# Patient Record
Sex: Female | Born: 2000 | Marital: Married | Smoking: Never smoker
Health system: Southern US, Community
[De-identification: ages and names within clinical notes are randomized; demographics above are authoritative.]

## PROBLEM LIST (undated history)

## (undated) DIAGNOSIS — E663 Overweight: Secondary | ICD-10-CM

## (undated) DIAGNOSIS — I871 Compression of vein: Secondary | ICD-10-CM

## (undated) DIAGNOSIS — U071 COVID-19: Secondary | ICD-10-CM

## (undated) HISTORY — DX: COVID-19: U07.1

## (undated) HISTORY — DX: Overweight: E66.3

## (undated) HISTORY — PX: WISDOM TOOTH EXTRACTION: SHX21

---
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
## 2022-01-23 NOTE — Therapy (Addendum)
?OUTPATIENT PHYSICAL THERAPY LOWER EXTREMITY EVALUATION ? ? ?Patient Name: Karen [name] ?MRN: [id number] ?DOB:04/30/2001, 21 y.o., female ?Today's Date: 01/24/2022 ? ? PT End of Session - 01/24/22 0956   ? ? Visit Number 1   ? Number of Visits 8   ? Date for PT Re-Evaluation 03/21/22   ? Authorization Type [place] Medicaid Healthy Blue   ? PT Start Time [phone]   pt late  ? PT Stop Time 1015   ? PT Time Calculation (min) 25 min   ? Activity Tolerance Patient tolerated treatment well   ? Behavior During [facility] for tasks assessed/performed   ? ?  ?  ? ?  ? ? ?Past Medical History:  ?Diagnosis Date  ? AKI (acute kidney injury) ([facility]) 11/23/2020  ? r/t dehydration - covid infection   ? COVID   ? Hepatic vein stenosis   ? Overweight(278.02) 03/03/2013  ? ?Past Surgical History:  ?Procedure Laterality Date  ? KNEE ARTHROSCOPY WITH ANTERIOR CRUCIATE LIGAMENT (ACL) REPAIR Left 04/13/2021  ? Procedure: KNEE ARTHROSCOPY WITH ANTERIOR CRUCIATE LIGAMENT (ACL) REPAIR WITH  LATERAL MENISECTOMY and DRILLING/MICROFRACTURE;  Surgeon: Teryl LucyLandau, Joshua, MD;  Location: [facility];  Service: Orthopedics;  Laterality: Left;  ? KNEE ARTHROSCOPY WITH MENISCAL REPAIR Left 04/13/2021  ? Procedure: KNEE ARTHROSCOPY WITH MENISCAL Root Repair;  Surgeon: Teryl LucyLandau, Joshua, MD;  Location: [facility];  Service: Orthopedics;  Laterality: Left;  ? WISDOM TOOTH EXTRACTION    ? ?Patient Active Problem List  ? Diagnosis Date Noted  ? Acute kidney injury ([facility]) 11/23/2020  ? Hepatic steatosis 11/23/2020  ? Obesity, pediatric, BMI 95th to 98th percentile for age 67/26/2022  ? Elevated BP without diagnosis of hypertension 11/23/2020  ? COVID-19 11/23/2020  ? AN (acanthosis nigricans) 12/09/2015  ? BMI (body mass index), pediatric, greater than or equal to 95% for age 14/07/2015  ? ? ?PCP: Richrd SoxJohnson, Quan T, MD ? ?REFERRING PROVIDER: Teryl LucyLandau, Joshua, MD ? ?REFERRING DIAG: 04/13/21: Lt Knee scope with ACL repair, lateral meniscectomy,  microfracture ? ?THERAPY DIAG:  ?S/P ACL repair ? ?Stiffness of left knee, not elsewhere classified ? ?Muscle weakness (generalized) ? ?Localized edema ? ?ONSET DATE: 04/13/21 ? ?SUBJECTIVE:  ? ?SUBJECTIVE STATEMENT: ?Patient was here for several visits last summer/fall.  She was in [place] for several months.  She continue to have issues with jogging (pain) and bending/squatting.  She reports buckling in her knee ( 1 x 2-3 weeks).  She reports some weakness with steps, climbing.  She goes to the gym (leg presses, light dead lifts) but does have pain during and sometimes after.  Dr. Dion SaucierLandau saw her in Feb. And said her knee is "the same".  ? ?PERTINENT HISTORY: ?6/22: ? ?KNEE ARTHROSCOPY WITH ANTERIOR CRUCIATE LIGAMENT (ACL) REPAIR WITH  LATERAL MENISECTOMY and DRILLING/MICROFRACTURE Left General KNEE ARTHROSCOPY WITH MENISCAL Root Repair  ? ?PAIN:  ?Are you having pain? No ? ?PRECAUTIONS: Other: none  ? ?WEIGHT BEARING RESTRICTIONS No ? ?FALLS:  ?Has patient fallen in last 6 months? No ? ?LIVING ENVIRONMENT: ?Lives with: lives with their family ?Lives in: House/apartment ?Stairs: Yes: External: 5 steps; none ?Has following equipment at home: None ? ?OCCUPATION: working at [employer] , part time  ? ?PLOF: Independent ? ?PATIENT GOALS Patient would like to get stronger, get better.  ? ? ?OBJECTIVE:  ? ?DIAGNOSTIC FINDINGS: none recent  ? ?PATIENT SURVEYS:  ?LEFS TBA  ? ?COGNITION: ? Overall cognitive status: Within functional limits for tasks assessed   ?  ?  SENSATION: ?WFL ? ?POSTURE:  ?WNL ? ?PALPATION: ?No tenderness in knee, some swelling laterally  ? ?LE ROM: ? ?Active ROM Right ?01/24/2022 Left ?01/24/2022  ?Hip flexion    ?Hip extension    ?Hip abduction    ?Hip adduction    ?Hip internal rotation    ?Hip external rotation    ?Knee flexion 130 120  ?Knee extension 0 4  ?Ankle dorsiflexion    ?Ankle plantarflexion    ?Ankle inversion    ?Ankle eversion    ? (Blank rows = not tested) ? ?LE MMT: ? ?MMT Right ?01/24/2022  Left ?01/24/2022  ?Hip flexion 5 5  ?Hip extension 5 5  ?Hip abduction 5 4+  ?Hip adduction 3+ 4  ?Hip internal rotation    ?Hip external rotation    ?Knee flexion 5 4+  ?Knee extension 5 4  ?Ankle dorsiflexion    ?Ankle plantarflexion    ?Ankle inversion    ?Ankle eversion    ? (Blank rows = not tested) ? ?8 deg quad lag  ? ? ?FUNCTIONAL TESTS:  ?Normal SLS  , weak and shaky quad eccentric off step ,unable to perform heel tap on Rt LE  ?  Good squat form , knee adducts slightly  ?GAIT: ?Distance walked: 250 ?Assistive device utilized: None ?Level of assistance: Complete Independence ?Comments: no deviations  ? ? ? ?TODAY'S TREATMENT: ?Eval only ? Leg press 1 plates single leg x 10 ?   3 plates double leg x 10  ? ?PATIENT EDUCATION:  ?Education details: PT, leg press and progressive overload ?Person educated: Patient ?Education method: Explanation, Demonstration, and Verbal cues ?Education comprehension: verbalized understanding and needs further education ? ? ?HOME EXERCISE PROGRAM: ?TBA  ? ?ASSESSMENT: ? ?CLINICAL IMPRESSION: ?Patient is a 21 y.o. female  who was seen today for physical therapy evaluation and treatment for L knee surgery. After several months she continues to have intermittent pain, swelling and reduced AROM of L knee.  She has poor control with decent down stairs. She continues to work out at [employer] but with insufficient weight to increase L quad strength.   ? ? ?OBJECTIVE IMPAIRMENTS decreased mobility, decreased ROM, decreased strength, increased edema, impaired flexibility, and pain.  ? ?ACTIVITY LIMITATIONS community activity.  ? ?PERSONAL FACTORS Time since onset of injury/illness/exacerbation are also affecting patient's functional outcome.  ? ? ?REHAB POTENTIAL: Excellent ? ?CLINICAL DECISION MAKING: Stable/uncomplicated ? ?EVALUATION COMPLEXITY: Low ? ? ?GOALS: ?Goals reviewed with patient? Yes ? ? ?LONG TERM GOALS: Target date: 03/21/2022 ? ?Pt will be able to perform gym exercises  without pain (leg press, dead lift, etc) ?Baseline: min pain post  ?Goal status: INITIAL ? ?2.  Pt will be able to do light jog without knee pain or instability, self selected distance/time.  ?Baseline: does rarely, pain min ?Goal status: INITIAL ? ?3.  Pt will be able descend steps with good reciprocal control, no UE assist ?Baseline: unable , 8 inch  ?Goal status: INITIAL ? ?4.  Pt will achieve AROM without pain 125 -130 deg.  ?Baseline: 120 deg flexion  ?Goal status: INITIAL ? ?5.  Pt will perform SLR on LLE without quad lag.  ?Baseline: 8-10 deg LLE ?Goal status: INITIAL ? ? ? ?PLAN: ?PT FREQUENCY: 1x/week ? ?PT DURATION: 8 weeks ? ?PLANNED INTERVENTIONS: Therapeutic exercises, Therapeutic activity, Neuromuscular re-education, Gait training, Patient/Family education, Joint mobilization, Cryotherapy, Taping, and Manual therapy ? ?PLAN FOR NEXT SESSION: LEFS.  HEP, quad strength , closed chain  ? ? ? ?  Karie Mainland, PT ?01/24/22 10:39 AM ?Phone: [phone] ?Fax: [phone]  ?Grainger Mccarley, PT ?01/24/2022, 10:22 AM  ? ? ?Check all possible CPT codes: 54650- Therapeutic Exercise, [phone]- Neuro Re-education, [phone] - Gait Training, [phone] - Manual Therapy, [phone] - Therapeutic Activities, [phone] - Self Care, [phone] - Vaso, and [phone] - Physical performance training    ? ?If treatment provided at initial evaluation, no treatment charged due to lack of authorization.    ?  ?

## 2022-01-24 ENCOUNTER — Other Ambulatory Visit: Payer: Self-pay

## 2022-01-24 ENCOUNTER — Ambulatory Visit: Payer: Medicaid Other | Attending: Orthopedic Surgery | Admitting: Physical Therapy

## 2022-01-24 ENCOUNTER — Encounter: Payer: Self-pay | Admitting: Physical Therapy

## 2022-01-24 DIAGNOSIS — Z9889 Other specified postprocedural states: Secondary | ICD-10-CM | POA: Insufficient documentation

## 2022-01-24 DIAGNOSIS — M25662 Stiffness of left knee, not elsewhere classified: Secondary | ICD-10-CM | POA: Insufficient documentation

## 2022-01-24 DIAGNOSIS — R600 Localized edema: Secondary | ICD-10-CM | POA: Insufficient documentation

## 2022-01-24 DIAGNOSIS — M6281 Muscle weakness (generalized): Secondary | ICD-10-CM | POA: Diagnosis not present

## 2022-02-01 ENCOUNTER — Ambulatory Visit: Payer: Medicaid Other | Attending: Orthopedic Surgery | Admitting: Physical Therapy

## 2022-02-01 ENCOUNTER — Encounter: Payer: Self-pay | Admitting: Physical Therapy

## 2022-02-01 DIAGNOSIS — Z9889 Other specified postprocedural states: Secondary | ICD-10-CM | POA: Diagnosis not present

## 2022-02-01 DIAGNOSIS — M6281 Muscle weakness (generalized): Secondary | ICD-10-CM | POA: Diagnosis not present

## 2022-02-01 DIAGNOSIS — M25662 Stiffness of left knee, not elsewhere classified: Secondary | ICD-10-CM | POA: Diagnosis not present

## 2022-02-01 DIAGNOSIS — R600 Localized edema: Secondary | ICD-10-CM | POA: Insufficient documentation

## 2022-02-01 NOTE — Therapy (Signed)
?OUTPATIENT PHYSICAL THERAPY TREATMENT NOTE ? ? ?Patient Name: Karen [name] ?MRN: [id number] ?DOB:2001/05/06, 21 y.o., female ?Today's Date: 02/01/2022 ? ?PCP: Richrd Sox, MD ?REFERRING PROVIDER: Richrd Sox, MD ? ?END OF SESSION:  ? PT End of Session - 02/01/22 1319   ? ? Visit Number 2   ? Number of Visits 8   ? Date for PT Re-Evaluation 03/21/22   ? Authorization Type [place] Medicaid Healthy Blue   ? Authorization Time Period 01/25/22-03/23/22   ? Authorization - Visit Number 1   ? Authorization - Number of Visits 8   ? PT Start Time 1315   ? PT Stop Time 1359   ? PT Time Calculation (min) 44 min   ? ?  ?  ? ?  ? ? ?Past Medical History:  ?Diagnosis Date  ? AKI (acute kidney injury) ([facility]) 11/23/2020  ? r/t dehydration - covid infection   ? COVID   ? Hepatic vein stenosis   ? Overweight(278.02) 03/03/2013  ? ?Past Surgical History:  ?Procedure Laterality Date  ? KNEE ARTHROSCOPY WITH ANTERIOR CRUCIATE LIGAMENT (ACL) REPAIR Left 04/13/2021  ? Procedure: KNEE ARTHROSCOPY WITH ANTERIOR CRUCIATE LIGAMENT (ACL) REPAIR WITH  LATERAL MENISECTOMY and DRILLING/MICROFRACTURE;  Surgeon: Teryl Lucy, MD;  Location: [facility];  Service: Orthopedics;  Laterality: Left;  ? KNEE ARTHROSCOPY WITH MENISCAL REPAIR Left 04/13/2021  ? Procedure: KNEE ARTHROSCOPY WITH MENISCAL Root Repair;  Surgeon: Teryl Lucy, MD;  Location: [facility];  Service: Orthopedics;  Laterality: Left;  ? WISDOM TOOTH EXTRACTION    ? ?Patient Active Problem List  ? Diagnosis Date Noted  ? Acute kidney injury ([facility]) 11/23/2020  ? Hepatic steatosis 11/23/2020  ? Obesity, pediatric, BMI 95th to 98th percentile for age 76/26/2022  ? Elevated BP without diagnosis of hypertension 11/23/2020  ? COVID-19 11/23/2020  ? AN (acanthosis nigricans) 12/09/2015  ? BMI (body mass index), pediatric, greater than or equal to 95% for age 72/07/2015  ?PCP: Richrd Sox, MD ?  ?REFERRING PROVIDER: Teryl Lucy, MD ?  ?REFERRING DIAG:  04/13/21: Lt Knee scope with ACL repair, lateral meniscectomy, microfracture ?  ?THERAPY DIAG:  ?S/P ACL repair ? ?THERAPY DIAG:  ?S/P ACL repair ? ?Stiffness of left knee, not elsewhere classified ? ?Muscle weakness (generalized) ? ?PERTINENT HISTORY: 6/22: ?  ?KNEE ARTHROSCOPY WITH ANTERIOR CRUCIATE LIGAMENT (ACL) REPAIR WITH  LATERAL MENISECTOMY and DRILLING/MICROFRACTURE Left General KNEE ARTHROSCOPY WITH MENISCAL Root Repair  ? ?PRECAUTIONS: None ? ?SUBJECTIVE: Not really having any pain. I started doing the exercises from before.  ? ? ?PAIN:  ?Are you having pain? No ? ? ?OBJECTIVE:  ?  ?DIAGNOSTIC FINDINGS: none recent  ?  ?PATIENT SURVEYS:  ?LEFS 59/80 (02/01/22) ?  ?COGNITION: ?          Overall cognitive status: Within functional limits for tasks assessed               ?           ?SENSATION: ?WFL ?  ?POSTURE:  ?WNL ?  ?PALPATION: ?No tenderness in knee, some swelling laterally  ?  ?LE ROM: ?  ?Active ROM Right ?01/24/2022 Left ?01/24/2022  ?Hip flexion      ?Hip extension      ?Hip abduction      ?Hip adduction      ?Hip internal rotation      ?Hip external rotation      ?Knee flexion 130 120  ?Knee extension 0 4  ?  Ankle dorsiflexion      ?Ankle plantarflexion      ?Ankle inversion      ?Ankle eversion      ? (Blank rows = not tested) ?  ?LE MMT: ?  ?MMT Right ?01/24/2022 Left ?01/24/2022  ?Hip flexion 5 5  ?Hip extension 5 5  ?Hip abduction 5 4+  ?Hip adduction 3+ 4  ?Hip internal rotation      ?Hip external rotation      ?Knee flexion 5 4+  ?Knee extension 5 4  ?Ankle dorsiflexion      ?Ankle plantarflexion      ?Ankle inversion      ?Ankle eversion      ? (Blank rows = not tested) ?  ?8 deg quad lag  ?  ?  ?FUNCTIONAL TESTS:  ?Normal SLS  , weak and shaky quad eccentric off step ,unable to perform heel tap on Rt LE  ?                      Good squat form , knee adducts slightly  ?GAIT: ?Distance walked: 250 ?Assistive device utilized: None ?Level of assistance: Complete Independence ?Comments: no deviations   ?  ?  ?  ?TODAY'S TREATMENT: ?[facility] Adult PT Treatment:                                                DATE: 02/01/22 ?Therapeutic Exercise: ?Elliptical L5 x 5 min ?Wall squats 5 sec x 12 ?6 inch runners step up ?4 inch lateral step down x 12 ?2 inch front step down x 12 ?Leg press 60# 10 x 2  ?Slant board stretch ?Gastroc stretch ?Quad stretch ?Hamstring stretch ?STS with RLE back  ? ?INITIAL TREATMENT:  ?Eval only ?  Leg press 1 plates single leg x 10 ?                      3 plates double leg x 10  ?  ?PATIENT EDUCATION:  ?Education details: PT, leg press and progressive overload ?Person educated: Patient ?Education method: Explanation, Demonstration, and Verbal cues ?Education comprehension: verbalized understanding and needs further education ?  ?  ?HOME EXERCISE PROGRAM: ?Access Code: [email or web address] ?URL: [email or web address] ?Date: 02/01/2022 ?Prepared by: Jannette Spanner ? ?Exercises ?- Runner's Step Up/Down  - 1 x daily - 7 x weekly - 3 sets - 10 reps ?- Single Leg Squat with Chair Touch  - 1 x daily - 7 x weekly - 3 sets - 10 reps ?- Supine Straight Leg Raises  - 1 x daily - 7 x weekly - 3 sets - 10 reps ?- Prone Quadriceps Stretch with Strap  - 1 x daily - 7 x weekly - 1 sets - 3 reps - 30 hold ?- Hooklying Hamstring Stretch with Strap  - 1 x daily - 7 x weekly - 1 sets - 3 reps - 30 hold ?  ?ASSESSMENT: ?  ?CLINICAL IMPRESSION: ?Karen [name] arrives for treatment without pain and reports that she has resumed prior HEP. Reviewed old HEP and issued a new version to start. Focused closed chain with pt reporting left plantar foot burning which she reports is a frequent occurrence. Added calf, quad and hamstring stretches.  ?  ?  ?OBJECTIVE IMPAIRMENTS decreased mobility, decreased ROM, decreased strength, increased edema, impaired flexibility, and pain.  ?  ?  ACTIVITY LIMITATIONS community activity.  ?  ?PERSONAL FACTORS Time since onset of injury/illness/exacerbation are also affecting patient's functional  outcome.  ?  ?  ?REHAB POTENTIAL: Excellent ?  ?CLINICAL DECISION MAKING: Stable/uncomplicated ?  ?EVALUATION COMPLEXITY: Low ?  ?  ?GOALS: ?Goals reviewed with patient? Yes ?  ?  ?LONG TERM GOALS: Target date: 03/21/2022 ?  ?Pt will be able to perform gym exercises without pain (leg press, dead lift, etc) ?Baseline: min pain post  ?Goal status: INITIAL ?  ?2.  Pt will be able to do light jog without knee pain or instability, self selected distance/time.  ?Baseline: does rarely, pain min ?Goal status: INITIAL ?  ?3.  Pt will be able descend steps with good reciprocal control, no UE assist ?Baseline: unable , 8 inch  ?Goal status: INITIAL ?  ?4.  Pt will achieve AROM without pain 125 -130 deg.  ?Baseline: 120 deg flexion  ?Goal status: INITIAL ?  ?5.  Pt will perform SLR on LLE without quad lag.  ?Baseline: 8-10 deg LLE ?Goal status: INITIAL ?  ?  ?  ?PLAN: ?PT FREQUENCY: 1x/week ?  ?PT DURATION: 8 weeks ?  ?PLANNED INTERVENTIONS: Therapeutic exercises, Therapeutic activity, Neuromuscular re-education, Gait training, Patient/Family education, Joint mobilization, Cryotherapy, Taping, and Manual therapy ?  ?PLAN FOR NEXT SESSION:   check new HEP and progress quad strength , closed chain  ?  ?  ?  ? ? ?Jannette SpannerJessica Graycie Halley, PTA ?02/01/22 2:13 PM ?Phone: [phone] ?Fax: [phone]  ? ?   ?

## 2022-02-12 ENCOUNTER — Ambulatory Visit: Payer: Medicaid Other | Admitting: Physical Therapy

## 2022-02-12 ENCOUNTER — Encounter: Payer: Self-pay | Admitting: Physical Therapy

## 2022-02-12 DIAGNOSIS — Z9889 Other specified postprocedural states: Secondary | ICD-10-CM | POA: Diagnosis not present

## 2022-02-12 DIAGNOSIS — R600 Localized edema: Secondary | ICD-10-CM

## 2022-02-12 DIAGNOSIS — M25662 Stiffness of left knee, not elsewhere classified: Secondary | ICD-10-CM

## 2022-02-12 DIAGNOSIS — M6281 Muscle weakness (generalized): Secondary | ICD-10-CM | POA: Diagnosis not present

## 2022-02-12 NOTE — Therapy (Signed)
?OUTPATIENT PHYSICAL THERAPY TREATMENT NOTE ? ? ?Patient Name: Karen [name] ?MRN: [id number] ?DOB:[birth year], 21 y.o., female ?Today's Date: 02/12/2022 ? ?PCP: Kyra Leyland, MD ?REFERRING PROVIDER: Kyra Leyland, MD ? ?END OF SESSION:  ? PT End of Session - 02/12/22 0934   ? ? Visit Number 3   ? Number of Visits 8   ? Date for PT Re-Evaluation 03/21/22   ? Authorization Type [place] Medicaid [employer]   ? Authorization Time Period 01/25/22-03/23/22   ? Authorization - Visit Number 2   ? Authorization - Number of Visits 8   ? PT Start Time 0930   ? PT Stop Time 1012   ? PT Time Calculation (min) 42 min   ? ?  ?  ? ?  ? ? ?Past Medical History:  ?Diagnosis Date  ? AKI (acute kidney injury) ([place]) 11/23/2020  ? r/t dehydration - covid infection   ? COVID   ? Hepatic vein stenosis   ? Overweight(278.02) 03/03/2013  ? ?Past Surgical History:  ?Procedure Laterality Date  ? KNEE ARTHROSCOPY WITH ANTERIOR CRUCIATE LIGAMENT (ACL) REPAIR Left 04/13/2021  ? Procedure: KNEE ARTHROSCOPY WITH ANTERIOR CRUCIATE LIGAMENT (ACL) REPAIR WITH  LATERAL MENISECTOMY and DRILLING/MICROFRACTURE;  Surgeon: Marchia Bond, MD;  Location: [facility];  Service: Orthopedics;  Laterality: Left;  ? KNEE ARTHROSCOPY WITH MENISCAL REPAIR Left 04/13/2021  ? Procedure: KNEE ARTHROSCOPY WITH MENISCAL Root Repair;  Surgeon: Marchia Bond, MD;  Location: [facility];  Service: Orthopedics;  Laterality: Left;  ? WISDOM TOOTH EXTRACTION    ? ?Patient Active Problem List  ? Diagnosis Date Noted  ? Acute kidney injury ([place]) 11/23/2020  ? Hepatic steatosis 11/23/2020  ? Obesity, pediatric, BMI 95th to 98th percentile for age 39/26/2022  ? Elevated BP without diagnosis of hypertension 11/23/2020  ? COVID-19 11/23/2020  ? AN (acanthosis nigricans) 12/09/2015  ? BMI (body mass index), pediatric, greater than or equal to 95% for age 74/07/2015  ?PCP: Kyra Leyland, MD ?  ?REFERRING PROVIDER: Marchia Bond, MD ?  ?REFERRING DIAG:  04/13/21: Lt Knee scope with ACL repair, lateral meniscectomy, microfracture ?  ?THERAPY DIAG:  ?S/P ACL repair ? ?THERAPY DIAG:  ?S/P ACL repair ? ?Stiffness of left knee, not elsewhere classified ? ?Muscle weakness (generalized) ? ?Localized edema ? ?PERTINENT HISTORY: 6/22: ?  ?KNEE ARTHROSCOPY WITH ANTERIOR CRUCIATE LIGAMENT (ACL) REPAIR WITH  LATERAL MENISECTOMY and DRILLING/MICROFRACTURE Left General KNEE ARTHROSCOPY WITH MENISCAL Root Repair  ? ?PRECAUTIONS: None ? ?SUBJECTIVE: The exercises have helped my confidence.  ? ? ?PAIN:  ?Are you having pain? No ? ? ?OBJECTIVE:  ?  ?DIAGNOSTIC FINDINGS: none recent  ?  ?PATIENT SURVEYS:  ?LEFS 59/80 (02/01/22) ?  ?COGNITION: ?          Overall cognitive status: Within functional limits for tasks assessed               ?           ?SENSATION: ?WFL ?  ?POSTURE:  ?WNL ?  ?PALPATION: ?No tenderness in knee, some swelling laterally  ?  ?LE ROM: ?  ?Active ROM Right ?01/24/2022 Left ?01/24/2022  ?Hip flexion      ?Hip extension      ?Hip abduction      ?Hip adduction      ?Hip internal rotation      ?Hip external rotation      ?Knee flexion 130 120  ?Knee extension 0 4  ?Ankle dorsiflexion      ?  Ankle plantarflexion      ?Ankle inversion      ?Ankle eversion      ? (Blank rows = not tested) ?  ?LE MMT: ?  ?MMT Right ?01/24/2022 Left ?01/24/2022  ?Hip flexion 5 5  ?Hip extension 5 5  ?Hip abduction 5 4+  ?Hip adduction 3+ 4  ?Hip internal rotation      ?Hip external rotation      ?Knee flexion 5 4+  ?Knee extension 5 4  ?Ankle dorsiflexion      ?Ankle plantarflexion      ?Ankle inversion      ?Ankle eversion      ? (Blank rows = not tested) ?  ?8 deg quad lag  ?  ?  ?FUNCTIONAL TESTS:  ?Normal SLS  , weak and shaky quad eccentric off step ,unable to perform heel tap on Rt LE  ?                      Good squat form , knee adducts slightly  ?GAIT: ?Distance walked: 250 ?Assistive device utilized: None ?Level of assistance: Complete Independence ?Comments: no deviations  ?  ?  ?   ?TODAY'S TREATMENT: ?[facility] Adult PT Treatment:                                                DATE: 02/12/22 ?Therapeutic Exercise: ?Rec Bike  L4 x 5 min ?Wall squats 5 sec x 10x2 ?6 inch runners step up 10x 2 - cues for control -no UE ?6 inch lateral step down x 10 x 2- cues for control - no UE ?2 inch front step down x 12- 1 UE assist  ?Leg press 60# 10 x 1, single leg 20# x 10 eccentric ?Slant board stretch- c/o heel pain ?SLS with ABCs holding small physioball- complained of plantar foot apin  ?Quad stretch with strap 30 sec x 2  ?Hamstring stretch ?STS with LLE back 10 x 2  ?Side hip adduction 10 x 2  ? ?[facility] Adult PT Treatment:                                                DATE: 02/01/22 ?Therapeutic Exercise: ?Elliptical L5 x 5 min ?Wall squats 5 sec x 12 ?6 inch runners step up ?4 inch lateral step down x 12 ?4 inch front step down x 10 ?Leg press 60# 10 x 2  ?Slant board stretch ?Gastroc stretch ?Quad stretch ?Hamstring stretch ?STS with LLE back  ? ?INITIAL TREATMENT:  ?Eval only ?  Leg press 1 plates single leg x 10 ?                      3 plates double leg x 10  ?  ?PATIENT EDUCATION:  ?Education details: PT, leg press and progressive overload ?Person educated: Patient ?Education method: Explanation, Demonstration, and Verbal cues ?Education comprehension: verbalized understanding and needs further education ?  ?  ?HOME EXERCISE PROGRAM: ?Access Code: [email or web address] ?URL: [email or web address] ?Date: 02/01/2022 ?Prepared by: Hessie Diener ? ?Exercises ?- Runner's Step Up/Down  - 1 x daily - 7 x weekly - 3 sets - 10 reps ?- Single Leg Squat with Chair  Touch  - 1 x daily - 7 x weekly - 3 sets - 10 reps ?- Supine Straight Leg Raises  - 1 x daily - 7 x weekly - 3 sets - 10 reps ?- Prone Quadriceps Stretch with Strap  - 1 x daily - 7 x weekly - 1 sets - 3 reps - 30 hold ?- Hooklying Hamstring Stretch with Strap  - 1 x daily - 7 x weekly - 1 sets - 3 reps - 30 hold ?  ?ASSESSMENT: ?  ?CLINICAL  IMPRESSION: ?[name] arrives for treatment without pain and reports that she has completed the new HEP and feels stronger and more confident in her left knee. She has not experienced plantar pain with her HEP so far. Reviewed HEP and she tolerated well without knee pain but did have plantar foot pain with SLS exercises.  ? ?OBJECTIVE IMPAIRMENTS decreased mobility, decreased ROM, decreased strength, increased edema, impaired flexibility, and pain.  ?  ?ACTIVITY LIMITATIONS community activity.  ?  ?PERSONAL FACTORS Time since onset of injury/illness/exacerbation are also affecting patient's functional outcome.  ?  ?  ?REHAB POTENTIAL: Excellent ?  ?CLINICAL DECISION MAKING: Stable/uncomplicated ?  ?EVALUATION COMPLEXITY: Low ?  ?  ?GOALS: ?Goals reviewed with patient? Yes ?  ?  ?LONG TERM GOALS: Target date: 03/21/2022 ?  ?Pt will be able to perform gym exercises without pain (leg press, dead lift, etc) ?Baseline: min pain post  ?Goal status: INITIAL ?  ?2.  Pt will be able to do light jog without knee pain or instability, self selected distance/time.  ?Baseline: does rarely, pain min ?Goal status: INITIAL ?  ?3.  Pt will be able descend steps with good reciprocal control, no UE assist ?Baseline: unable , 8 inch  ?Goal status: INITIAL ?  ?4.  Pt will achieve AROM without pain 125 -130 deg.  ?Baseline: 120 deg flexion  ?Goal status: INITIAL ?  ?5.  Pt will perform SLR on LLE without quad lag.  ?Baseline: 8-10 deg LLE ?Goal status: INITIAL ?  ?  ?  ?PLAN: ?PT FREQUENCY: 1x/week ?  ?PT DURATION: 8 weeks ?  ?PLANNED INTERVENTIONS: Therapeutic exercises, Therapeutic activity, Neuromuscular re-education, Gait training, Patient/Family education, Joint mobilization, Cryotherapy, Taping, and Manual therapy ?  ?PLAN FOR NEXT SESSION:   check new HEP and progress quad strength , closed chain , monitor plantar foot pain ?  ?  ?  ? ? ?Hessie Diener, PTA ?02/12/22 10:12 AM ?Phone: [phone] ?Fax: [phone]  ? ?   ?

## 2022-02-19 ENCOUNTER — Ambulatory Visit: Payer: Medicaid Other | Admitting: Physical Therapy

## 2022-02-19 ENCOUNTER — Encounter: Payer: Self-pay | Admitting: Physical Therapy

## 2022-02-19 DIAGNOSIS — Z9889 Other specified postprocedural states: Secondary | ICD-10-CM

## 2022-02-19 DIAGNOSIS — R600 Localized edema: Secondary | ICD-10-CM

## 2022-02-19 DIAGNOSIS — M6281 Muscle weakness (generalized): Secondary | ICD-10-CM

## 2022-02-19 DIAGNOSIS — M25662 Stiffness of left knee, not elsewhere classified: Secondary | ICD-10-CM

## 2022-02-19 NOTE — Therapy (Addendum)
OUTPATIENT PHYSICAL THERAPY TREATMENT NOTE Addended Discharge   Patient Name: Karen [name] MRN: [id number] DOB:02-10-2001, 21 y.o., female Today's Date: 02/19/2022  PCP: Kyra Leyland, MD REFERRING PROVIDER: Kyra Leyland, MD  END OF SESSION:   PT End of Session - 02/19/22 1022     Visit Number 4    Number of Visits 8    Date for PT Re-Evaluation 03/21/22    Authorization Type [place] Medicaid Healthy Blue    Authorization Time Period 01/25/22-03/23/22    Authorization - Visit Number 3    Authorization - Number of Visits 8    PT Start Time 2119    PT Stop Time 4174    PT Time Calculation (min) 40 min             Past Medical History:  Diagnosis Date   AKI (acute kidney injury) ([place]) 11/23/2020   r/t dehydration - covid infection    COVID    Hepatic vein stenosis    Overweight(278.02) 03/03/2013   Past Surgical History:  Procedure Laterality Date   KNEE ARTHROSCOPY WITH ANTERIOR CRUCIATE LIGAMENT (ACL) REPAIR Left 04/13/2021   Procedure: KNEE ARTHROSCOPY WITH ANTERIOR CRUCIATE LIGAMENT (ACL) REPAIR WITH  LATERAL MENISECTOMY and DRILLING/MICROFRACTURE;  Surgeon: Marchia Bond, MD;  Location: [place];  Service: Orthopedics;  Laterality: Left;   KNEE ARTHROSCOPY WITH MENISCAL REPAIR Left 04/13/2021   Procedure: KNEE ARTHROSCOPY WITH MENISCAL Root Repair;  Surgeon: Marchia Bond, MD;  Location: [facility];  Service: Orthopedics;  Laterality: Left;   WISDOM TOOTH EXTRACTION     Patient Active Problem List   Diagnosis Date Noted   Acute kidney injury ([place]) 11/23/2020   Hepatic steatosis 11/23/2020   Obesity, pediatric, BMI 95th to 98th percentile for age 67/26/2022   Elevated BP without diagnosis of hypertension 11/23/2020   COVID-19 11/23/2020   AN (acanthosis nigricans) 12/09/2015   BMI (body mass index), pediatric, greater than or equal to 95% for age 62/07/2015  PCP: Kyra Leyland, MD   REFERRING PROVIDER: Marchia Bond, MD    REFERRING DIAG: 04/13/21: Lt Knee scope with ACL repair, lateral meniscectomy, microfracture   THERAPY DIAG:  S/P ACL repair  THERAPY DIAG:  S/P ACL repair  Stiffness of left knee, not elsewhere classified  Localized edema  Muscle weakness (generalized)  PERTINENT HISTORY: 6/22:   KNEE ARTHROSCOPY WITH ANTERIOR CRUCIATE LIGAMENT (ACL) REPAIR WITH  LATERAL MENISECTOMY and DRILLING/MICROFRACTURE Left General KNEE ARTHROSCOPY WITH MENISCAL Root Repair   PRECAUTIONS: None  SUBJECTIVE: The exercises have helped my confidence.    PAIN:  Are you having pain? No   OBJECTIVE:    DIAGNOSTIC FINDINGS: none recent    PATIENT SURVEYS:  LEFS 59/80 (02/01/22)   COGNITION:           Overall cognitive status: Within functional limits for tasks assessed                          SENSATION: WFL   POSTURE:  WNL   PALPATION: No tenderness in knee, some swelling laterally    LE ROM:   Active ROM Right 01/24/2022 Left 01/24/2022 Left 02/19/22  Hip flexion       Hip extension       Hip abduction       Hip adduction       Hip internal rotation       Hip external rotation       Knee flexion  130 120 120  Knee extension 0 4 0  Ankle dorsiflexion       Ankle plantarflexion       Ankle inversion       Ankle eversion        (Blank rows = not tested)   LE MMT:   MMT Right 01/24/2022 Left 01/24/2022  Hip flexion 5 5  Hip extension 5 5  Hip abduction 5 4+  Hip adduction 3+ 4  Hip internal rotation      Hip external rotation      Knee flexion 5 4+  Knee extension 5 4  Ankle dorsiflexion      Ankle plantarflexion      Ankle inversion      Ankle eversion       (Blank rows = not tested)   8 deg quad lag      FUNCTIONAL TESTS:  Normal SLS  , weak and shaky quad eccentric off step ,unable to perform heel tap on Rt LE                        Good squat form , knee adducts slightly  GAIT: Distance walked: 250 Assistive device utilized: None Level of assistance: Complete  Independence Comments: no deviations        TODAY'S TREATMENT: [facility] Adult PT Treatment:                                                DATE: 02/19/22 Therapeutic Exercise: Elliptical L5 R5 x 5 minutes  Wall squats 5 sec x 10x2  10#  8 inch runners step up 10x 2 - cues for control -no UE 6 inch lateral step down x 10 x 2- cues for control - no UE 2 inch front step down x 12- 1 UE assist -has plantar pain Static lunge in doorway x 12  Slant board stretch Plantar/toe extension stretch against [employer] leg  AROM knee flexion 0-120  Quad stretch with strap 30 sec x 2  Hamstring stretch Standing Df stretch at wall  Side hip adduction 10 x 2 each  [facility] Adult PT Treatment:                                                DATE: 02/12/22 Therapeutic Exercise: Rec Bike  L4 x 5 min Wall squats 5 sec x 10x2 6 inch runners step up 10x 2 - cues for control -no UE 6 inch lateral step down x 10 x 2- cues for control - no UE 2 inch front step down x 12- 1 UE assist  Leg press 60# 10 x 1, single leg 20# x 10 eccentric Slant board stretch- c/o heel pain SLS with ABCs holding small physioball- complained of plantar foot apin  Quad stretch with strap 30 sec x 2  Hamstring stretch STS with LLE back 10 x 2  Side hip adduction 10 x 2   [facility] Adult PT Treatment:  DATE: 02/01/22 Therapeutic Exercise: Elliptical L5 x 5 min Wall squats 5 sec x 12 6 inch runners step up 4 inch lateral step down x 12 4 inch front step down x 10 Leg press 60# 10 x 2  Slant board stretch Gastroc stretch Quad stretch Hamstring stretch STS with LLE back   INITIAL TREATMENT:  Eval only   Leg press 1 plates single leg x 10                       3 plates double leg x 10    PATIENT EDUCATION:  Education details: PT, leg press and progressive overload Person educated: Patient Education method: Consulting civil engineer, Demonstration, and Verbal cues Education comprehension: verbalized  understanding and needs further education     HOME EXERCISE PROGRAM: Access Code: [email or web address] URL: [email or web address] Date: 02/19/2022 Prepared by: Hessie Diener  Exercises - Runner's Step Up/Down  - 1 x daily - 7 x weekly - 3 sets - 10 reps - Single Leg Squat with Chair Touch  - 1 x daily - 7 x weekly - 3 sets - 10 reps - Supine Straight Leg Raises  - 1 x daily - 7 x weekly - 3 sets - 10 reps - Prone Quadriceps Stretch with Strap  - 1 x daily - 7 x weekly - 1 sets - 3 reps - 30 hold - Hooklying Hamstring Stretch with Strap  - 1 x daily - 7 x weekly - 1 sets - 3 reps - 30 hold - Standing Gastroc Stretch at Counter  - 1 x daily - 7 x weekly - 1 sets - 3 reps - 30 hold   ASSESSMENT:   CLINICAL IMPRESSION: Mayci arrives for treatment without pain and reports improved confidence with stair climbing and squatting/bending over.  Knee flexion AROM improved for extension- no longer has quad lag. Knee flexion AROM unchanged. Reviewed prone quad stretch. She continues to have intermittent plantar pain with single leg exercises on LLE. Visual inspection of supine LEs reveals decreased ankle DF left vs right. She was given DF stretch for HEP. Otherwise ankle strength 5/5 DF, inv, ev. Need to test SL heel raise for calf strength.   OBJECTIVE IMPAIRMENTS decreased mobility, decreased ROM, decreased strength, increased edema, impaired flexibility, and pain.    ACTIVITY LIMITATIONS community activity.    PERSONAL FACTORS Time since onset of injury/illness/exacerbation are also affecting patient's functional outcome.      REHAB POTENTIAL: Excellent   CLINICAL DECISION MAKING: Stable/uncomplicated   EVALUATION COMPLEXITY: Low     GOALS: Goals reviewed with patient? Yes     LONG TERM GOALS: Target date: 03/21/2022   Pt will be able to perform gym exercises without pain (leg press, dead lift, etc) Baseline: min pain post  Goal status: INITIAL   2.  Pt will be able to do light  jog without knee pain or instability, self selected distance/time.  Baseline: does rarely, pain min Goal status: INITIAL   3.  Pt will be able descend steps with good reciprocal control, no UE assist Baseline: unable , 8 inch  Goal status: INITIAL   4.  Pt will achieve AROM without pain 125 -130 deg.  Baseline: 120 deg flexion  Goal status: INITIAL   5.  Pt will perform SLR on LLE without quad lag.  Baseline: 8-10 deg LLE Goal status: INITIAL       PLAN: PT FREQUENCY: 1x/week   PT DURATION: 8 weeks   PLANNED INTERVENTIONS: Therapeutic exercises,  Therapeutic activity, Neuromuscular re-education, Gait training, Patient/Family education, Joint mobilization, Cryotherapy, Taping, and Manual therapy   PLAN FOR NEXT SESSION: check PF strength with single heel raise;   check new HEP and progress quad strength , closed chain , monitor plantar foot pain         Hessie Diener, PTA 02/19/22 11:00 AM Phone: [phone] Fax: [phone]     PHYSICAL THERAPY DISCHARGE SUMMARY  Visits from Start of Care: 4  Current functional level related to goals / functional outcomes: See above    Remaining deficits: Unknown    Education / Equipment: HEP    Patient agrees to discharge. Patient goals were partially met. Patient is being discharged due to not returning since the last visit.  Raeford Razor, PT 06/04/22 12:20 PM Phone: [phone] Fax: [phone]

## 2022-02-26 ENCOUNTER — Ambulatory Visit: Payer: Medicaid Other | Admitting: Physical Therapy

## 2022-02-28 ENCOUNTER — Ambulatory Visit: Payer: Medicaid Other | Admitting: Pediatrics

## 2022-03-06 NOTE — Therapy (Deleted)
OUTPATIENT PHYSICAL THERAPY TREATMENT NOTE   Patient Name: Karen [name] MRN: [id number] DOB:Oct 06, 2001, 21 y.o., female Today's Date: 03/06/2022  PCP: Richrd Sox, MD REFERRING PROVIDER: Teryl Lucy, MD  END OF SESSION:     Past Medical History:  Diagnosis Date   AKI (acute kidney injury) (HCC) 11/23/2020   r/t dehydration - covid infection    COVID    Hepatic vein stenosis    Overweight(278.02) 03/03/2013   Past Surgical History:  Procedure Laterality Date   KNEE ARTHROSCOPY WITH ANTERIOR CRUCIATE LIGAMENT (ACL) REPAIR Left 04/13/2021   Procedure: KNEE ARTHROSCOPY WITH ANTERIOR CRUCIATE LIGAMENT (ACL) REPAIR WITH  LATERAL MENISECTOMY and DRILLING/MICROFRACTURE;  Surgeon: Teryl Lucy, MD;  Location: [facility];  Service: Orthopedics;  Laterality: Left;   KNEE ARTHROSCOPY WITH MENISCAL REPAIR Left 04/13/2021   Procedure: KNEE ARTHROSCOPY WITH MENISCAL Root Repair;  Surgeon: Teryl Lucy, MD;  Location:  SURGERY CENTER;  Service: Orthopedics;  Laterality: Left;   WISDOM TOOTH EXTRACTION     Patient Active Problem List   Diagnosis Date Noted   Acute kidney injury (HCC) 11/23/2020   Hepatic steatosis 11/23/2020   Obesity, pediatric, BMI 95th to 98th percentile for age 28/26/2022   Elevated BP without diagnosis of hypertension 11/23/2020   COVID-19 11/23/2020   AN (acanthosis nigricans) 12/09/2015   BMI (body mass index), pediatric, greater than or equal to 95% for age 74/07/2015  PCP: Richrd Sox, MD   REFERRING PROVIDER: Teryl Lucy, MD   REFERRING DIAG: 04/13/21: Lt Knee scope with ACL repair, lateral meniscectomy, microfracture   THERAPY DIAG:  S/P ACL repair  THERAPY DIAG:  No diagnosis found.  PERTINENT HISTORY: 6/22:   KNEE ARTHROSCOPY WITH ANTERIOR CRUCIATE LIGAMENT (ACL) REPAIR WITH  LATERAL MENISECTOMY and DRILLING/MICROFRACTURE Left General KNEE ARTHROSCOPY WITH MENISCAL Root Repair   PRECAUTIONS:  None  SUBJECTIVE: The exercises have helped my confidence.    PAIN:  Are you having pain? No   OBJECTIVE:    DIAGNOSTIC FINDINGS: none recent    PATIENT SURVEYS:  LEFS 59/80 (02/01/22)   COGNITION:           Overall cognitive status: Within functional limits for tasks assessed                          SENSATION: WFL   POSTURE:  WNL   PALPATION: No tenderness in knee, some swelling laterally    LE ROM:   Active ROM Right 01/24/2022 Left 01/24/2022 Left 02/19/22  Hip flexion       Hip extension       Hip abduction       Hip adduction       Hip internal rotation       Hip external rotation       Knee flexion 130 120 120  Knee extension 0 4 0  Ankle dorsiflexion       Ankle plantarflexion       Ankle inversion       Ankle eversion        (Blank rows = not tested)   LE MMT:   MMT Right 01/24/2022 Left 01/24/2022  Hip flexion 5 5  Hip extension 5 5  Hip abduction 5 4+  Hip adduction 3+ 4  Hip internal rotation      Hip external rotation      Knee flexion 5 4+  Knee extension 5 4  Ankle dorsiflexion  Ankle plantarflexion      Ankle inversion      Ankle eversion       (Blank rows = not tested)   8 deg quad lag      FUNCTIONAL TESTS:  Normal SLS  , weak and shaky quad eccentric off step ,unable to perform heel tap on Rt LE                        Good squat form , knee adducts slightly  GAIT: Distance walked: 250 Assistive device utilized: None Level of assistance: Complete Independence Comments: no deviations        TODAY'S TREATMENT: [facility] Adult PT Treatment:                                                DATE: 02/19/22 Therapeutic Exercise: Elliptical L5 R5 x 5 minutes  Wall squats 5 sec x 10x2  10#  8 inch runners step up 10x 2 - cues for control -no UE 6 inch lateral step down x 10 x 2- cues for control - no UE 2 inch front step down x 12- 1 UE assist -has plantar pain Static lunge in doorway x 12  Slant board stretch Plantar/toe  extension stretch against [employer] leg  AROM knee flexion 0-120  Quad stretch with strap 30 sec x 2  Hamstring stretch Standing Df stretch at wall  Side hip adduction 10 x 2 each  [facility] Adult PT Treatment:                                                DATE: 02/12/22 Therapeutic Exercise: Rec Bike  L4 x 5 min Wall squats 5 sec x 10x2 6 inch runners step up 10x 2 - cues for control -no UE 6 inch lateral step down x 10 x 2- cues for control - no UE 2 inch front step down x 12- 1 UE assist  Leg press 60# 10 x 1, single leg 20# x 10 eccentric Slant board stretch- c/o heel pain SLS with ABCs holding small physioball- complained of plantar foot apin  Quad stretch with strap 30 sec x 2  Hamstring stretch STS with LLE back 10 x 2  Side hip adduction 10 x 2   [facility] Adult PT Treatment:                                                DATE: 02/01/22 Therapeutic Exercise: Elliptical L5 x 5 min Wall squats 5 sec x 12 6 inch runners step up 4 inch lateral step down x 12 4 inch front step down x 10 Leg press 60# 10 x 2  Slant board stretch Gastroc stretch Quad stretch Hamstring stretch STS with LLE back   INITIAL TREATMENT:  Eval only   Leg press 1 plates single leg x 10                       3 plates double leg x 10    PATIENT EDUCATION:  Education details: PT, leg  press and progressive overload Person educated: Patient Education method: Explanation, Demonstration, and Verbal cues Education comprehension: verbalized understanding and needs further education     HOME EXERCISE PROGRAM: Access Code: [email or web address] URL: [email or web address] Date: 02/19/2022 Prepared by: Jannette SpannerJessica Donoho  Exercises - Runner's Step Up/Down  - 1 x daily - 7 x weekly - 3 sets - 10 reps - Single Leg Squat with Chair Touch  - 1 x daily - 7 x weekly - 3 sets - 10 reps - Supine Straight Leg Raises  - 1 x daily - 7 x weekly - 3 sets - 10 reps - Prone Quadriceps Stretch with Strap  - 1 x daily - 7 x  weekly - 1 sets - 3 reps - 30 hold - Hooklying Hamstring Stretch with Strap  - 1 x daily - 7 x weekly - 1 sets - 3 reps - 30 hold - Standing Gastroc Stretch at Counter  - 1 x daily - 7 x weekly - 1 sets - 3 reps - 30 hold   ASSESSMENT:   CLINICAL IMPRESSION: Ronnald CollumLexie arrives for treatment without pain and reports improved confidence with stair climbing and squatting/bending over.  Knee flexion AROM improved for extension- no longer has quad lag. Knee flexion AROM unchanged. Reviewed prone quad stretch. She continues to have intermittent plantar pain with single leg exercises on LLE. Visual inspection of supine LEs reveals decreased ankle DF left vs right. She was given DF stretch for HEP. Otherwise ankle strength 5/5 DF, inv, ev. Need to test SL heel raise for calf strength.   OBJECTIVE IMPAIRMENTS decreased mobility, decreased ROM, decreased strength, increased edema, impaired flexibility, and pain.    ACTIVITY LIMITATIONS community activity.    PERSONAL FACTORS Time since onset of injury/illness/exacerbation are also affecting patient's functional outcome.      REHAB POTENTIAL: Excellent   CLINICAL DECISION MAKING: Stable/uncomplicated   EVALUATION COMPLEXITY: Low     GOALS: Goals reviewed with patient? Yes     LONG TERM GOALS: Target date: 03/21/2022   Pt will be able to perform gym exercises without pain (leg press, dead lift, etc) Baseline: min pain post  Goal status: INITIAL   2.  Pt will be able to do light jog without knee pain or instability, self selected distance/time.  Baseline: does rarely, pain min Goal status: INITIAL   3.  Pt will be able descend steps with good reciprocal control, no UE assist Baseline: unable , 8 inch  Goal status: INITIAL   4.  Pt will achieve AROM without pain 125 -130 deg.  Baseline: 120 deg flexion  Goal status: INITIAL   5.  Pt will perform SLR on LLE without quad lag.  Baseline: 8-10 deg LLE Goal status: INITIAL       PLAN: PT  FREQUENCY: 1x/week   PT DURATION: 8 weeks   PLANNED INTERVENTIONS: Therapeutic exercises, Therapeutic activity, Neuromuscular re-education, Gait training, Patient/Family education, Joint mobilization, Cryotherapy, Taping, and Manual therapy   PLAN FOR NEXT SESSION: check PF strength with single heel raise;   check new HEP and progress quad strength , closed chain , monitor plantar foot pain         Jannette SpannerJessica Donoho, PTA 03/06/22 6:42 PM Phone: [phone] Fax: [phone]

## 2022-03-07 ENCOUNTER — Ambulatory Visit: Payer: Medicaid Other | Attending: Orthopedic Surgery | Admitting: Physical Therapy

## 2022-07-02 ENCOUNTER — Other Ambulatory Visit: Payer: Self-pay

## 2022-07-02 ENCOUNTER — Ambulatory Visit
Admission: EM | Admit: 2022-07-02 | Discharge: 2022-07-02 | Disposition: A | Discharge status: Home / Self Care | Payer: Medicaid Other | Attending: Family Medicine | Admitting: Family Medicine

## 2022-07-02 DIAGNOSIS — Z3201 Encounter for pregnancy test, result positive: Secondary | ICD-10-CM | POA: Diagnosis not present

## 2022-07-02 DIAGNOSIS — R55 Syncope and collapse: Secondary | ICD-10-CM | POA: Insufficient documentation

## 2022-07-02 DIAGNOSIS — N390 Urinary tract infection, site not specified: Secondary | ICD-10-CM | POA: Insufficient documentation

## 2022-07-02 LAB — POCT URINALYSIS DIP (MANUAL ENTRY)
Bilirubin, UA: NEGATIVE
Glucose, UA: NEGATIVE mg/dL
Nitrite, UA: POSITIVE — AB
Protein Ur, POC: 30 mg/dL — AB
Spec Grav, UA: 1.02 (ref 1.010–1.025)
Urobilinogen, UA: 1 E.U./dL
pH, UA: 6 (ref 5.0–8.0)

## 2022-07-02 LAB — POCT FASTING CBG KUC MANUAL ENTRY: POCT Glucose (KUC): 83 mg/dL (ref 70–99)

## 2022-07-02 LAB — POCT URINE PREGNANCY: Preg Test, Ur: POSITIVE — AB

## 2022-07-02 MED ORDER — NITROFURANTOIN MONOHYD MACRO 100 MG PO CAPS: 100.0000 mg | ORAL_CAPSULE | Freq: Two times a day (BID) | ORAL | 0 refills | Status: DC

## 2022-07-02 NOTE — ED Provider Notes (Signed)
[facility]    CSN: [id number] Arrival date & time: 07/02/22  1110      History   Chief Complaint Chief Complaint  Patient presents with   Loss of Consciousness    HPI Karen [name] is a 21 y.o. female.   Patient presenting today with a syncopal episode that occurred at church yesterday after standing for about 10 minutes.  She states she was in her usual state of health that morning, did not eat but did drink some water.  States that is typical for her before church.  She denies any chest pain, shortness of breath, palpitations, dizziness, headaches, vision change, nausea, vomiting prior to syncopal episode and did not hit her head, lose bowel or bladder control, by her tongue and was not confused or disoriented when she woke back up.  She has felt in her usual state of health since the incident.  No new medications or supplements, no recent dietary changes, no recent illness.  She states she had her menstrual cycle 2 weeks ago but wants to be checked for pregnancy anyways.  No known history of cardiac or neurologic issues.    Past Medical History:  Diagnosis Date   AKI (acute kidney injury) (HCC) 11/23/2020   r/t dehydration - covid infection    COVID    Hepatic vein stenosis    Overweight(278.02) 03/03/2013    Patient Active Problem List   Diagnosis Date Noted   Acute kidney injury (HCC) 11/23/2020   Hepatic steatosis 11/23/2020   Obesity, pediatric, BMI 95th to 98th percentile for age 55/26/2022   Elevated BP without diagnosis of hypertension 11/23/2020   COVID-19 11/23/2020   AN (acanthosis nigricans) 12/09/2015   BMI (body mass index), pediatric, greater than or equal to 95% for age 56/07/2015    Past Surgical History:  Procedure Laterality Date   KNEE ARTHROSCOPY WITH ANTERIOR CRUCIATE LIGAMENT (ACL) REPAIR Left 04/13/2021   Procedure: KNEE ARTHROSCOPY WITH ANTERIOR CRUCIATE LIGAMENT (ACL) REPAIR WITH  LATERAL MENISECTOMY and DRILLING/MICROFRACTURE;   Surgeon: Teryl Lucy, MD;  Location: [facility];  Service: Orthopedics;  Laterality: Left;   KNEE ARTHROSCOPY WITH MENISCAL REPAIR Left 04/13/2021   Procedure: KNEE ARTHROSCOPY WITH MENISCAL Root Repair;  Surgeon: Teryl Lucy, MD;  Location: [facility];  Service: Orthopedics;  Laterality: Left;   WISDOM TOOTH EXTRACTION      OB History     Gravida  1   Para      Term      Preterm      AB      Living         SAB      IAB      Ectopic      Multiple      Live Births               Home Medications    Prior to Admission medications   Medication Sig Start Date End Date Taking? Authorizing Provider  nitrofurantoin, macrocrystal-monohydrate, (MACROBID) 100 MG capsule Take 1 capsule (100 mg total) by mouth 2 (two) times daily. 07/02/22  Yes Particia Nearing, PA-C  baclofen (LIORESAL) 10 MG tablet Take 1 tablet (10 mg total) by mouth 3 (three) times daily. As needed for muscle spasm Patient not taking: Reported on 05/29/2021 04/13/21   Janine Ores K, PA-C  ondansetron (ZOFRAN) 4 MG tablet Take 1 tablet (4 mg total) by mouth every 8 (eight) hours as needed for nausea or vomiting. Patient  not taking: Reported on 05/29/2021 04/13/21   Armida Sans, PA-C  oxyCODONE (ROXICODONE) 5 MG immediate release tablet Take 1 tablet (5 mg total) by mouth every 4 (four) hours as needed for severe pain. Patient not taking: Reported on 05/29/2021 04/13/21   Janine Ores K, PA-C  sennosides-docusate sodium (SENOKOT-S) 8.6-50 MG tablet Take 2 tablets by mouth daily. While taking pain medication. This medication is for constipation caused by the oxycodone. Patient not taking: Reported on 05/29/2021 04/13/21   Armida Sans, PA-C    Family History Family History  Problem Relation Age of Onset   Thyroid disease Mother    Diabetes Father    Hypertension Father    Healthy Sister    Healthy Maternal Grandmother    Healthy Maternal Grandfather    Healthy  Paternal Grandmother    Healthy Paternal Grandfather    Cancer Neg Hx    Heart disease Neg Hx     Social History Social History   Tobacco Use   Smoking status: Never   Smokeless tobacco: Never  Vaping Use   Vaping Use: Never used  Substance Use Topics   Alcohol use: Never   Drug use: Never     Allergies   Patient has no known allergies.   Review of Systems Review of Systems Per HPI  Physical Exam Triage Vital Signs ED Triage Vitals  Enc Vitals Group     BP 07/02/22 1209 120/78     Pulse Rate 07/02/22 1209 94     Resp 07/02/22 1209 18     Temp 07/02/22 1209 98.4 F (36.9 C)     Temp src --      SpO2 07/02/22 1209 97 %     Weight --      Height --      Head Circumference --      Peak Flow --      Pain Score 07/02/22 1208 0     Pain Loc --      Pain Edu? --      Excl. in GC? --    Orthostatic VS for the past 24 hrs:  BP- Lying Pulse- Lying BP- Sitting Pulse- Sitting BP- Standing at 0 minutes Pulse- Standing at 0 minutes  07/02/22 1309 117/74 94 117/75 94 131/84 94    Updated Vital Signs BP 120/78   Pulse 94   Temp 98.4 F (36.9 C)   Resp 18   LMP 06/18/2022   SpO2 97%   Visual Acuity Right Eye Distance:   Left Eye Distance:   Bilateral Distance:    Right Eye Near:   Left Eye Near:    Bilateral Near:     Physical Exam Vitals and nursing note reviewed.  Constitutional:      Appearance: Normal appearance. She is not ill-appearing.  HENT:     Head: Atraumatic.     Mouth/Throat:     Mouth: Mucous membranes are moist.  Eyes:     Extraocular Movements: Extraocular movements intact.     Conjunctiva/sclera: Conjunctivae normal.  Cardiovascular:     Rate and Rhythm: Normal rate and regular rhythm.     Heart sounds: Normal heart sounds.  Pulmonary:     Effort: Pulmonary effort is normal.     Breath sounds: Normal breath sounds.  Abdominal:     General: Bowel sounds are normal. There is no distension.     Palpations: Abdomen is soft.      Tenderness: There is no abdominal tenderness. There  is no right CVA tenderness, left CVA tenderness or guarding.  Musculoskeletal:        General: Normal range of motion.     Cervical back: Normal range of motion and neck supple.  Skin:    General: Skin is warm and dry.  Neurological:     Mental Status: She is alert and oriented to person, place, and time.     Motor: No weakness.     Gait: Gait normal.  Psychiatric:        Mood and Affect: Mood normal.        Thought Content: Thought content normal.        Judgment: Judgment normal.      UC Treatments / Results  Labs (all labs ordered are listed, but only abnormal results are displayed) Labs Reviewed  POCT URINALYSIS DIP (MANUAL ENTRY) - Abnormal; Notable for the following components:      Result Value   Ketones, POC UA small (15) (*)    Blood, UA trace-intact (*)    Protein Ur, POC =30 (*)    Nitrite, UA Positive (*)    Leukocytes, UA Small (1+) (*)    All other components within normal limits  POCT URINE PREGNANCY - Abnormal; Notable for the following components:   Preg Test, Ur Positive (*)    All other components within normal limits  URINE CULTURE  POCT FASTING CBG [facility] MANUAL ENTRY    EKG   Radiology No results found.  Procedures Procedures (including critical care time)  Medications Ordered in UC Medications - No data to display  Initial Impression / Assessment and Plan / UC Course  I have reviewed the triage vital signs and the nursing notes.  Pertinent labs & imaging results that were available during my care of the patient were reviewed by me and considered in my medical decision making (see chart for details).     Vital signs and exam completely benign and reassuring today.  EKG showing normal sinus rhythm at 94 bpm without acute ST or T wave changes.  Point-of-care glucose within normal limits, orthostatics without significant change.  Urinalysis showing possible urinary tract infection, urine  culture pending, treat with Macrobid while awaiting these results.  Urine pregnancy test came back positive.  Discussed both of these results at length with patient and her mother who is with her today and who patient gave permission to share results in front of [place].  Discussed prenatal vitamin, healthy diet and lifestyle changes, OB/GYN follow-up.  Regarding her syncopal episode, possibly vasovagal in nature but monitor closely and if any subsequent issues go to the emergency department for more in-depth evaluation.  Final Clinical Impressions(s) / UC Diagnoses   Final diagnoses:  Acute lower UTI  Positive pregnancy test  Syncope, unspecified syncope type     Discharge Instructions      Your pregnancy test today was positive.  I have placed a local OB/GYN resource in your follow-up instructions, however you may reach out to anyone that you feel comfortable with to provide your care through pregnancy.  I recommend you start a prenatal vitamin and make sure to avoid alcohol, drug use and exercise regularly, eat healthy.  I have also sent in an antibiotic to treat your possible urinary tract infection while we wait for a urine culture for further clarification of this.  Make sure to stay well-hydrated, follow-up if having further passing out spells or other abnormal symptoms.    ED Prescriptions  Medication Sig Dispense Auth. Provider   nitrofurantoin, macrocrystal-monohydrate, (MACROBID) 100 MG capsule Take 1 capsule (100 mg total) by mouth 2 (two) times daily. 10 capsule Particia Nearing, [place]      PDMP not reviewed this encounter.   Particia Nearing, [place] 07/02/22 1335

## 2022-07-02 NOTE — ED Triage Notes (Signed)
Pt states that she passed out yesterday at church while she had been standing for about 10 minutes. Pts family witnessed pt fall to pew. Denies any injury or pain. LOC for few minutes

## 2022-07-02 NOTE — Discharge Instructions (Signed)
Your pregnancy test today was positive.  I have placed a local OB/GYN resource in your follow-up instructions, however you may reach out to anyone that you feel comfortable with to provide your care through pregnancy.  I recommend you start a prenatal vitamin and make sure to avoid alcohol, drug use and exercise regularly, eat healthy.  I have also sent in an antibiotic to treat your possible urinary tract infection while we wait for a urine culture for further clarification of this.  Make sure to stay well-hydrated, follow-up if having further passing out spells or other abnormal symptoms.

## 2022-07-04 LAB — URINE CULTURE: Culture: >=100000 — AB

## 2022-08-29 ENCOUNTER — Ambulatory Visit: Payer: Medicaid Other | Admitting: Pediatrics

## 2022-10-29 NOTE — L&D Delivery Note (Signed)
Delivery Note   Karen [name] is a 22 y.o. G1P0 at [redacted]w[redacted]d Estimated Date of Delivery: 12/15/22  PRE-OPERATIVE DIAGNOSIS:  1) 104w5d pregnancy.     PPROM, meconium  POST-OPERATIVE DIAGNOSIS:  1) [redacted]w[redacted]d pregnancy s/p Vaginal, Spontaneous    Delivery Type: Vaginal, Spontaneous    Delivery Anesthesia: Epidural   Labor Complications: None    ESTIMATED BLOOD LOSS: 150  ml    FINDINGS:   1) female infant, "Amy," Apgar scores of    at 1 minute and    at 5 minutes and a birthweight of 94.89  ounces.     SPECIMENS:   PLACENTA:   Appearance: Intact , meconium-stained, marginal cord insertion   Removal: Spontaneous      Disposition: Per protocol  CORD BLOOD: Collected  DISPOSITION:  Infant left in stable condition in the delivery room, with L&D personnel and mother  NARRATIVE SUMMARY: Labor course:  Karen [name] is a G1P0 at [redacted]w[redacted]d who presented to Labor & Delivery for PPROM. Her initial cervical exam was 1.5/50/-2. Labor proceeded with augmentation, and she was found to be completely dilated at 2022. With excellent maternal pushing effort, she birthed a viable female infant at 2048. There was not a nuchal cord. The shoulders were birthed without difficulty. The infant was placed skin-to-skin with Karen [name]. The cord was doubly clamped and cut by the father when pulsations ceased. The placenta delivered spontaneously and was noted to be intact with a 3VC and a marginal cord insertion. A perineal and vaginal examination was performed. Episiotomy/Lacerations: None . Small hemostatic left labial laceration that did not require stitches Karen [name] tolerated this well. Mother and baby were left in stable condition.   Lloyd Huger, CNM 11/22/2022 9:09 PM

## 2022-11-07 ENCOUNTER — Ambulatory Visit (INDEPENDENT_AMBULATORY_CARE_PROVIDER_SITE_OTHER): Payer: Medicaid Other

## 2022-11-07 VITALS — Wt 187.0 lb

## 2022-11-07 DIAGNOSIS — Z3689 Encounter for other specified antenatal screening: Secondary | ICD-10-CM

## 2022-11-07 DIAGNOSIS — Z369 Encounter for antenatal screening, unspecified: Secondary | ICD-10-CM

## 2022-11-07 DIAGNOSIS — Z3480 Encounter for supervision of other normal pregnancy, unspecified trimester: Secondary | ICD-10-CM | POA: Insufficient documentation

## 2022-11-07 NOTE — Progress Notes (Signed)
New OB Intake  I connected with  Kellie Moor on 11/07/22 at  2:15 PM EST by telephone and verified that I am speaking with the correct person using two identifiers. Nurse is located at [employer] and pt is located at home.  I explained I am completing New OB Intake today. We discussed her EDD of 03/25/2023 that is based on LMP of 06/18/2022. Pt is G1/P0. I reviewed her allergies, medications, Medical/Surgical/OB history, and appropriate screenings. There are cats in the home  no If yes  Based on history, this is a/an pregnancy uncomplicated .   Patient Active Problem List   Diagnosis Date Noted   Supervision of other normal pregnancy, antepartum 11/07/2022   Acute kidney injury ([place]) 11/23/2020   Hepatic steatosis 11/23/2020   Obesity, pediatric, BMI 95th to 98th percentile for age 51/26/2022   Elevated BP without diagnosis of hypertension 11/23/2020   COVID-19 11/23/2020   AN (acanthosis nigricans) 12/09/2015   BMI (body mass index), pediatric, greater than or equal to 95% for age 58/07/2015    Concerns addressed today None  Delivery Plans:  Plans to deliver at  [facility].  Anatomy US Explained first scheduled [place] will be scheduled soon and an anatomy scan will be done at 20 weeks.  Labs Discussed genetic screening with patient. Patient declines genetic testing. Discussed possible labs to be drawn at new OB appointment.  COVID Vaccine Patient has had COVID vaccine.   Social Determinants of Health Food Insecurity: denies food insecurity Transportation: Patient denies transportation needs.  First visit review I reviewed new OB appt with pt. I explained she will have ob bloodwork and pap smear/pelvic exam if indicated. Explained pt will be seen by Dr. Jeannie Fend at first visit; encounter routed to appropriate provider.   Cleophas Dunker, [facility] 11/07/2022  2:39 PM

## 2022-11-08 ENCOUNTER — Telehealth: Payer: Self-pay | Admitting: Obstetrics and Gynecology

## 2022-11-08 NOTE — Telephone Encounter (Signed)
-----   Message from Cleophas Dunker, [place] sent at 11/07/2022  2:43 PM EST ----- Please schedule pt for NOB labs, anatomy scan, and with a provider.  Please let me know who the provider is so I can complete my note and enter orders..Thanks

## 2022-11-08 NOTE — Telephone Encounter (Signed)
I contacted patient via phone to scheduled NOB appointment. Patient is scheduled for 1/16 with Dr. Amalia Hailey. I advised patient to contact centralized scheduling for ultrasound appointment at [phone] options. Patient is aware to call back to scheduled for for lab appointment!

## 2022-11-08 NOTE — Patient Instructions (Signed)
Second Trimester of Pregnancy  The second trimester of pregnancy is from week 13 through week 27. This is months 4 through 6 of pregnancy. The second trimester is often a time when you feel your best. Your body has adjusted to being pregnant, and you begin to feel better physically. During the second trimester: Morning sickness has lessened or stopped completely. You may have more energy. You may have an increase in appetite. The second trimester is also a time when the unborn baby (fetus) is growing rapidly. At the end of the sixth month, the fetus may be up to 12 inches long and weigh about 1 pounds. You will likely begin to feel the baby move (quickening) between 16 and 20 weeks of pregnancy. Body changes during your second trimester Your body continues to go through many changes during your second trimester. The changes vary and generally return to normal after the baby is born. Physical changes Your weight will continue to increase. You will notice your lower abdomen bulging out. You may begin to get stretch marks on your hips, abdomen, and breasts. Your breasts will continue to grow and to become tender. Dark spots or blotches (chloasma or mask of pregnancy) may develop on your face. A dark line from your belly button to the pubic area (linea nigra) may appear. You may have changes in your hair. These can include thickening of your hair, rapid growth, and changes in texture. Some people also have hair loss during or after pregnancy, or hair that feels dry or thin. Health changes You may develop headaches. You may have heartburn. You may develop constipation. You may develop hemorrhoids or swollen, bulging veins (varicose veins). Your gums may bleed and may be sensitive to brushing and flossing. You may urinate more often because the fetus is pressing on your bladder. You may have back pain. This is caused by: Weight gain. Pregnancy hormones that are relaxing the joints in your  pelvis. A shift in weight and the muscles that support your balance. Follow these instructions at home: Medicines Follow your health care provider's instructions regarding medicine use. Specific medicines may be either safe or unsafe to take during pregnancy. Do not take any medicines unless approved by your health care provider. Take a prenatal vitamin that contains at least 600 micrograms (mcg) of folic acid. Eating and drinking Eat a healthy diet that includes fresh fruits and vegetables, whole grains, good sources of protein such as meat, eggs, or tofu, and low-fat dairy products. Avoid raw meat and unpasteurized juice, milk, and cheese. These carry germs that can harm you and your baby. You may need to take these actions to prevent or treat constipation: Drink enough fluid to keep your urine pale yellow. Eat foods that are high in fiber, such as beans, whole grains, and fresh fruits and vegetables. Limit foods that are high in fat and processed sugars, such as fried or sweet foods. Activity Exercise only as directed by your health care provider. Most people can continue their usual exercise routine during pregnancy. Try to exercise for 30 minutes at least 5 days a week. Stop exercising if you develop contractions in your uterus. Stop exercising if you develop pain or cramping in the lower abdomen or lower back. Avoid exercising if it is very hot or humid or if you are at a high altitude. Avoid heavy lifting. If you choose to, you may have sex unless your health care provider tells you not to. Relieving pain and discomfort Wear a supportive   bra to prevent discomfort from breast tenderness. Take warm sitz baths to soothe any pain or discomfort caused by hemorrhoids. Use hemorrhoid cream if your health care provider approves. Rest with your legs raised (elevated) if you have leg cramps or low back pain. If you develop varicose veins: Wear support hose as told by your health care  provider. Elevate your feet for 15 minutes, 3-4 times a day. Limit salt in your diet. Safety Wear your seat belt at all times when driving or riding in a car. Talk with your health care provider if someone is verbally or physically abusive to you. Lifestyle Do not use hot tubs, steam rooms, or saunas. Do not douche. Do not use tampons or scented sanitary pads. Avoid cat litter boxes and soil used by cats. These carry germs that can cause birth defects in the baby and possibly loss of the fetus by miscarriage or stillbirth. Do not use herbal remedies, alcohol, illegal drugs, or medicines that are not approved by your health care provider. Chemicals in these products can harm your baby. Do not use any products that contain nicotine or tobacco, such as cigarettes, e-cigarettes, and chewing tobacco. If you need help quitting, ask your health care provider. General instructions During a routine prenatal visit, your health care provider will do a physical exam and other tests. He or she will also discuss your overall health. Keep all follow-up visits. This is important. Ask your health care provider for a referral to a local prenatal education class. Ask for help if you have counseling or nutritional needs during pregnancy. Your health care provider can offer advice or refer you to specialists for help with various needs. Where to find more information American Pregnancy Association: americanpregnancy.org American College of Obstetricians and Gynecologists: acog.org/en/Womens%20Health/Pregnancy Office on Women's Health: womenshealth.gov/pregnancy Contact a health care provider if you have: A headache that does not go away when you take medicine. Vision changes or you see spots in front of your eyes. Mild pelvic cramps, pelvic pressure, or nagging pain in the abdominal area. Persistent nausea, vomiting, or diarrhea. A bad-smelling vaginal discharge or foul-smelling urine. Pain when you  urinate. Sudden or extreme swelling of your face, hands, ankles, feet, or legs. A fever. Get help right away if you: Have fluid leaking from your vagina. Have spotting or bleeding from your vagina. Have severe abdominal cramping or pain. Have difficulty breathing. Have chest pain. Have fainting spells. Have not felt your baby move for the time period told by your health care provider. Have new or increased pain, swelling, or redness in an arm or leg. Summary The second trimester of pregnancy is from week 13 through week 27 (months 4 through 6). Do not use herbal remedies, alcohol, illegal drugs, or medicines that are not approved by your health care provider. Chemicals in these products can harm your baby. Exercise only as directed by your health care provider. Most people can continue their usual exercise routine during pregnancy. Keep all follow-up visits. This is important. This information is not intended to replace advice given to you by your health care provider. Make sure you discuss any questions you have with your health care provider. Document Revised: 03/23/2020 Document Reviewed: 01/28/2020 Elsevier Patient Education  2023 Elsevier Inc. Commonly Asked Questions During Pregnancy  Cats: A parasite can be excreted in cat feces.  To avoid exposure you need to have another person empty the little box.  If you must empty the litter box you will need to wear gloves.    Wash your hands after handling your cat.  This parasite can also be found in raw or undercooked meat so this should also be avoided.  Colds, Sore Throats, Flu: Please check your medication sheet to see what you can take for symptoms.  If your symptoms are unrelieved by these medications please call the office.  Dental Work: Most any dental work your dentist recommends is permitted.  X-rays should only be taken during the first trimester if absolutely necessary.  Your abdomen should be shielded with a lead apron during all  x-rays.  Please notify your provider prior to receiving any x-rays.  Novocaine is fine; gas is not recommended.  If your dentist requires a note from us prior to dental work please call the office and we will provide one for you.  Exercise: Exercise is an important part of staying healthy during your pregnancy.  You may continue most exercises you were accustomed to prior to pregnancy.  Later in your pregnancy you will most likely notice you have difficulty with activities requiring balance like riding a bicycle.  It is important that you listen to your body and avoid activities that put you at a higher risk of falling.  Adequate rest and staying well hydrated are a must!  If you have questions about the safety of specific activities ask your provider.    Exposure to Children with illness: Try to avoid obvious exposure; report any symptoms to us when noted,  If you have chicken pos, red measles or mumps, you should be immune to these diseases.   Please do not take any vaccines while pregnant unless you have checked with your OB provider.  Fetal Movement: After 28 weeks we recommend you do "kick counts" twice daily.  Lie or sit down in a calm quiet environment and count your baby movements "kicks".  You should feel your baby at least 10 times per hour.  If you have not felt 10 kicks within the first hour get up, walk around and have something sweet to eat or drink then repeat for an additional hour.  If count remains less than 10 per hour notify your provider.  Fumigating: Follow your pest control agent's advice as to how long to stay out of your home.  Ventilate the area well before re-entering.  Hemorrhoids:   Most over-the-counter preparations can be used during pregnancy.  Check your medication to see what is safe to use.  It is important to use a stool softener or fiber in your diet and to drink lots of liquids.  If hemorrhoids seem to be getting worse please call the office.   Hot Tubs:  Hot tubs  Jacuzzis and saunas are not recommended while pregnant.  These increase your internal body temperature and should be avoided.  Intercourse:  Sexual intercourse is safe during pregnancy as long as you are comfortable, unless otherwise advised by your provider.  Spotting may occur after intercourse; report any bright red bleeding that is heavier than spotting.  Labor:  If you know that you are in labor, please go to the hospital.  If you are unsure, please call the office and let us help you decide what to do.  Lifting, straining, etc:  If your job requires heavy lifting or straining please check with your provider for any limitations.  Generally, you should not lift items heavier than that you can lift simply with your hands and arms (no back muscles)  Painting:  Paint fumes do not harm your pregnancy,   but may make you ill and should be avoided if possible.  Latex or water based paints have less odor than oils.  Use adequate ventilation while painting.  Permanents & Hair Color:  Chemicals in hair dyes are not recommended as they cause increase hair dryness which can increase hair loss during pregnancy.  " Highlighting" and permanents are allowed.  Dye may be absorbed differently and permanents may not hold as well during pregnancy.  Sunbathing:  Use a sunscreen, as skin burns easily during pregnancy.  Drink plenty of fluids; avoid over heating.  Tanning Beds:  Because their possible side effects are still unknown, tanning beds are not recommended.  Ultrasound Scans:  Routine ultrasounds are performed at approximately 20 weeks.  You will be able to see your baby's general anatomy an if you would like to know the gender this can usually be determined as well.  If it is questionable when you conceived you may also receive an ultrasound early in your pregnancy for dating purposes.  Otherwise ultrasound exams are not routinely performed unless there is a medical necessity.  Although you can request a scan  we ask that you pay for it when conducted because insurance does not cover " patient request" scans.  Work: If your pregnancy proceeds without complications you may work until your due date, unless your physician or employer advises otherwise.  Round Ligament Pain/Pelvic Discomfort:  Sharp, shooting pains not associated with bleeding are fairly common, usually occurring in the second trimester of pregnancy.  They tend to be worse when standing up or when you remain standing for long periods of time.  These are the result of pressure of certain pelvic ligaments called "round ligaments".  Rest, Tylenol and heat seem to be the most effective relief.  As the womb and fetus grow, they rise out of the pelvis and the discomfort improves.  Please notify the office if your pain seems different than that described.  It may represent a more serious condition.  Common Medications Safe in Pregnancy  Acne:      Constipation:  Benzoyl Peroxide     Colace  Clindamycin      Dulcolax Suppository  Topica Erythromycin     Fibercon  Salicylic Acid      Metamucil         Miralax AVOID:        Senakot   Accutane    Cough:  Retin-A       Cough Drops  Tetracycline      Phenergan w/ Codeine if Rx  Minocycline      Robitussin (Plain & DM)  Antibiotics:     Crabs/Lice:  Ceclor       RID  Cephalosporins    AVOID:  E-Mycins      Kwell  Keflex  Macrobid/Macrodantin   Diarrhea:  Penicillin      Kao-Pectate  Zithromax      Imodium AD         PUSH FLUIDS AVOID:       Cipro     Fever:  Tetracycline      Tylenol (Regular or Extra  Minocycline       Strength)  Levaquin      Extra Strength-Do not          Exceed 8 tabs/24 hrs Caffeine:        <200mg/day (equiv. To 1 cup of coffee or  approx. 3 12 oz sodas)         Gas: Cold/Hayfever:         Gas-X  Benadryl      Mylicon  Claritin       Phazyme  **Claritin-D        Chlor-Trimeton    Headaches:  Dimetapp      ASA-Free Excedrin  Drixoral-Non-Drowsy     Cold  Compress  Mucinex (Guaifenasin)     Tylenol (Regular or Extra  Sudafed/Sudafed-12 Hour     Strength)  **Sudafed PE Pseudoephedrine   Tylenol Cold & Sinus     Vicks Vapor Rub  Zyrtec  **AVOID if Problems With Blood Pressure         Heartburn: Avoid lying down for at least 1 hour after meals  Aciphex      Maalox     Rash:  Milk of Magnesia     Benadryl    Mylanta       1% Hydrocortisone Cream  Pepcid  Pepcid Complete   Sleep Aids:  Prevacid      Ambien   Prilosec       Benadryl  Rolaids       Chamomile Tea  Tums (Limit 4/day)     Unisom         Tylenol PM         Warm milk-add vanilla or  Hemorrhoids:       Sugar for taste  Anusol/Anusol H.C.  (RX: Analapram 2.5%)  Sugar Substitutes:  Hydrocortisone OTC     Ok in moderation  Preparation H      Tucks        Vaseline lotion applied to tissue with wiping    Herpes:     Throat:  Acyclovir      Oragel  Famvir  Valtrex     Vaccines:         Flu Shot Leg Cramps:       *Gardasil  Benadryl      Hepatitis A         Hepatitis B Nasal Spray:       Pneumovax  Saline Nasal Spray     Polio Booster         Tetanus Nausea:       Tuberculosis test or PPD  Vitamin B6 25 mg TID   AVOID:    Dramamine      *Gardasil  Emetrol       Live Poliovirus  Ginger Root 250 mg QID    MMR (measles, mumps &  High Complex Carbs @ Bedtime    rebella)  Sea Bands-Accupressure    Varicella (Chickenpox)  Unisom 1/2 tab TID     *No known complications           If received before Pain:         Known pregnancy;   Darvocet       Resume series after  Lortab        Delivery  Percocet    Yeast:   Tramadol      Femstat  Tylenol 3      Gyne-lotrimin  Ultram       Monistat  Vicodin           MISC:         All Sunscreens           Hair Coloring/highlights          Insect Repellant's          (Including DEET)         Mystic Tans  

## 2022-11-09 ENCOUNTER — Encounter: Payer: Self-pay | Admitting: Obstetrics and Gynecology

## 2022-11-09 NOTE — Telephone Encounter (Signed)
I contacted patient via phone. I asked patient if she would be able to come in today for NOB labs. Patient states she has a meeting today is not available. I advised patient we would do her labs with her office visit with Dr Amalia Hailey on 1/16.

## 2022-11-13 ENCOUNTER — Encounter: Payer: Medicaid Other | Admitting: Obstetrics and Gynecology

## 2022-11-13 ENCOUNTER — Telehealth: Payer: Self-pay | Admitting: Obstetrics and Gynecology

## 2022-11-13 DIAGNOSIS — Z3A21 21 weeks gestation of pregnancy: Secondary | ICD-10-CM

## 2022-11-13 DIAGNOSIS — Z3402 Encounter for supervision of normal first pregnancy, second trimester: Secondary | ICD-10-CM

## 2022-11-13 DIAGNOSIS — Z7689 Persons encountering health services in other specified circumstances: Secondary | ICD-10-CM

## 2022-11-13 NOTE — Telephone Encounter (Signed)
Reached out to pt to reschedule NOB appt that was scheduled on 11/13/2022 with Dr. Amalia Hailey at 1:30.  Left message for pt to call back.

## 2022-11-14 ENCOUNTER — Telehealth: Payer: Self-pay | Admitting: Obstetrics and Gynecology

## 2022-11-14 NOTE — Telephone Encounter (Signed)
I contacted patient via phone. Patient's appointment was rescheduled for 1/22 with Missy

## 2022-11-14 NOTE — Telephone Encounter (Signed)
Reached out to pt on 11/13/2022 about the NOB appt that was scheduled on 11/13/2022 at 1:30 with Dr. Amalia Hailey.  Pt is scheduled with Missy on 11/19/2022 at 2:55 for the NOB appt.

## 2022-11-19 ENCOUNTER — Other Ambulatory Visit (INDEPENDENT_AMBULATORY_CARE_PROVIDER_SITE_OTHER): Payer: Medicaid Other

## 2022-11-19 ENCOUNTER — Other Ambulatory Visit: Payer: Self-pay | Admitting: Obstetrics

## 2022-11-19 ENCOUNTER — Encounter: Payer: Self-pay | Admitting: Obstetrics

## 2022-11-19 ENCOUNTER — Other Ambulatory Visit (HOSPITAL_COMMUNITY)
Admission: RE | Admit: 2022-11-19 | Discharge: 2022-11-19 | Disposition: A | Discharge status: Home / Self Care | Payer: Medicaid Other | Source: Ambulatory Visit | Attending: Obstetrics | Admitting: Obstetrics

## 2022-11-19 ENCOUNTER — Ambulatory Visit (INDEPENDENT_AMBULATORY_CARE_PROVIDER_SITE_OTHER): Payer: Medicaid Other | Admitting: Obstetrics

## 2022-11-19 VITALS — BP 145/80 | HR 80 | Wt 203.0 lb

## 2022-11-19 DIAGNOSIS — Z369 Encounter for antenatal screening, unspecified: Secondary | ICD-10-CM

## 2022-11-19 DIAGNOSIS — Z3A22 22 weeks gestation of pregnancy: Secondary | ICD-10-CM

## 2022-11-19 DIAGNOSIS — Z1379 Encounter for other screening for genetic and chromosomal anomalies: Secondary | ICD-10-CM

## 2022-11-19 DIAGNOSIS — Z3480 Encounter for supervision of other normal pregnancy, unspecified trimester: Secondary | ICD-10-CM

## 2022-11-19 DIAGNOSIS — Z3A36 36 weeks gestation of pregnancy: Secondary | ICD-10-CM

## 2022-11-19 DIAGNOSIS — O0932 Supervision of pregnancy with insufficient antenatal care, second trimester: Secondary | ICD-10-CM

## 2022-11-19 DIAGNOSIS — O0933 Supervision of pregnancy with insufficient antenatal care, third trimester: Secondary | ICD-10-CM

## 2022-11-19 DIAGNOSIS — Z3482 Encounter for supervision of other normal pregnancy, second trimester: Secondary | ICD-10-CM

## 2022-11-19 DIAGNOSIS — Z23 Encounter for immunization: Secondary | ICD-10-CM | POA: Diagnosis not present

## 2022-11-19 DIAGNOSIS — Z113 Encounter for screening for infections with a predominantly sexual mode of transmission: Secondary | ICD-10-CM

## 2022-11-19 NOTE — Progress Notes (Signed)
NEW OB HISTORY AND PHYSICAL  SUBJECTIVE:       Karen [name] is a 22 y.o. G1P0 female, Patient's last menstrual period was 06/18/2022 (exact date)., Estimated Date of Delivery: 03/25/23, [redacted]w[redacted]d, presents today for establishment of Prenatal Care. She reports no complaints. She started feeling fetal movement about 2-3 weeks ago.   Social history Partner/Relationship: FOB involved. Feels safe in the relationship Living situation: lives with parents. Feels safe. Work: [employer] Exercise: walking an hour daily Substance use: denies EtOH, tobacco, vape, and recreational drugs   Gynecologic History Patient's last menstrual period was 06/18/2022 (exact date). Normal Contraception: none Last Pap: N/A (age 24)   Obstetric History OB History  Gravida Para Term Preterm AB Living  1            SAB IAB Ectopic Multiple Live Births               # Outcome Date GA Lbr Len/2nd Weight Sex Delivery Anes PTL Lv  1 Current             Past Medical History:  Diagnosis Date   AKI (acute kidney injury) (HCC) 11/23/2020   r/t dehydration - covid infection    COVID    Hepatic vein stenosis    Overweight(278.02) 03/03/2013    Past Surgical History:  Procedure Laterality Date   KNEE ARTHROSCOPY WITH ANTERIOR CRUCIATE LIGAMENT (ACL) REPAIR Left 04/13/2021   Procedure: KNEE ARTHROSCOPY WITH ANTERIOR CRUCIATE LIGAMENT (ACL) REPAIR WITH  LATERAL MENISECTOMY and DRILLING/MICROFRACTURE;  Surgeon: Teryl Lucy, MD;  Location: [facility];  Service: Orthopedics;  Laterality: Left;   KNEE ARTHROSCOPY WITH MENISCAL REPAIR Left 04/13/2021   Procedure: KNEE ARTHROSCOPY WITH MENISCAL Root Repair;  Surgeon: Teryl Lucy, MD;  Location: [facility];  Service: Orthopedics;  Laterality: Left;   WISDOM TOOTH EXTRACTION      Current Outpatient Medications on File Prior to Visit  Medication Sig Dispense Refill   Prenatal Vit-Fe Fumarate-FA (MULTIVITAMIN-PRENATAL) 27-0.8 MG TABS  tablet Take 1 tablet by mouth daily at 12 noon.     No current facility-administered medications on file prior to visit.    No Known Allergies  Social History   Socioeconomic History   Marital status: Single    Spouse name: Not on file   Number of children: 0   Years of education: 12.5   Highest education level: Not on file  Occupational History   Occupation: college student  Tobacco Use   Smoking status: Never   Smokeless tobacco: Never  Vaping Use   Vaping Use: Never used  Substance and Sexual Activity   Alcohol use: Never   Drug use: Never   Sexual activity: Yes    Birth control/protection: None  Other Topics Concern   Not on file  Social History Narrative   Lives with both parents and siblings   Social Determinants of Health   Financial Resource Strain: Low Risk  (11/07/2022)   Overall Financial Resource Strain (CARDIA)    Difficulty of Paying Living Expenses: Not very hard  Food Insecurity: No Food Insecurity (11/07/2022)   Hunger Vital Sign    Worried About Running Out of Food in the Last Year: Never true    Ran Out of Food in the Last Year: Never true  Transportation Needs: No Transportation Needs (11/07/2022)   PRAPARE - Administrator, Civil Service (Medical): No    Lack of Transportation (Non-Medical): No  Physical Activity: Sufficiently Active (11/07/2022)  Exercise Vital Sign    Days of Exercise per Week: 5 days    Minutes of Exercise per Session: 60 min  Stress: No Stress Concern Present (11/07/2022)   [place]    Feeling of Stress : Not at all  Social Connections: Unknown (11/07/2022)   Social Connection and Isolation Panel [NHANES]    Frequency of Communication with Friends and Family: More than three times a week    Frequency of Social Gatherings with Friends and Family: Twice a week    Attends Religious Services: More than 4 times per year    Active Member of [employer] or  Organizations: No    Attends Archivist Meetings: Never    Marital Status: Not on file  Intimate Partner Violence: Not At Risk (11/07/2022)   Humiliation, Afraid, Rape, and Kick questionnaire    Fear of Current or Ex-Partner: No    Emotionally Abused: No    Physically Abused: No    Sexually Abused: No    Family History  Problem Relation Age of Onset   Thyroid disease Mother    Diabetes Father    Hypertension Father    Healthy Sister    Healthy Sister    Healthy Maternal Grandmother    Healthy Maternal Grandfather    Healthy Paternal Grandmother    Healthy Paternal Grandfather    Cancer Neg Hx    Heart disease Neg Hx     The following portions of the patient's history were reviewed and updated as appropriate: allergies, current medications, past OB history, past medical history, past surgical history, past family history, past social history, and problem list.  Constitutional: Denied constitutional symptoms, night sweats, recent illness, fatigue, fever, insomnia and weight loss.  Eyes: Denied eye symptoms, eye pain, photophobia, vision change and visual disturbance.  Ears/Nose/Throat/Neck: Denied ear, nose, throat or neck symptoms, hearing loss, nasal discharge, sinus congestion and sore throat.  Cardiovascular: Denied cardiovascular symptoms, arrhythmia, chest pain/pressure, edema, exercise intolerance, orthopnea and palpitations.  Respiratory: Denied pulmonary symptoms, asthma, pleuritic pain, productive sputum, cough, dyspnea and wheezing.  Gastrointestinal: Denied, gastro-esophageal reflux, melena, nausea and vomiting.  Genitourinary: Denied genitourinary symptoms including symptomatic vaginal discharge, pelvic relaxation issues, and urinary complaints.  Musculoskeletal: Denied musculoskeletal symptoms, stiffness, swelling, muscle weakness and myalgia. Joint pain in left knee  Dermatologic: Denied dermatology symptoms, rash and scar.  Neurologic: Denied neurology  symptoms, dizziness, headache, neck pain and syncope.  Psychiatric: Denied psychiatric symptoms, anxiety and depression.  Endocrine: Denied endocrine symptoms including hot flashes and night sweats.     OBJECTIVE: Initial Physical Exam (New OB)  GENERAL APPEARANCE: alert, well appearing HEAD: normocephalic, atraumatic MOUTH: mucous membranes moist, pharynx normal without lesions THYROID: no thyromegaly or masses present BREASTS: no masses noted, no significant tenderness, no palpable axillary nodes, no skin changes LUNGS: clear to auscultation, no wheezes, rales or rhonchi, symmetric air entry HEART: regular rate and rhythm, no murmurs ABDOMEN: soft, nontender, nondistended, no abnormal masses, no epigastric pain, fundus soft, nontender 34 weeks size, FHT present (138 bpm) EXTREMITIES: no redness or tenderness in the calves or thighs SKIN: normal coloration and turgor, no rashes LYMPH NODES: no adenopathy palpable NEUROLOGIC: alert, oriented, normal speech, no focal findings or movement disorder noted  PELVIC EXAM EXTERNAL GENITALIA: normal appearing vulva with no masses, tenderness or lesions UTERUS: gravid and consistent with 34-35 weeks OB EXAM PELVIMETRY: appears adequate  [place] today shows 36+2 weeks of pregnancy with EDD of 12/15/22 ASSESSMENT:  Normal pregnancy   PLAN: Routine prenatal care. We discussed an overview of prenatal care and when to call. Reviewed diet, exercise, and weight gain recommendations in pregnancy. Discussed benefits of breastfeeding and lactation resources at [facility]. Discussed s/s of labor and when to go to the hospital. I answered all questions. NOB labs, HgbA1C drawn; GBS and GC/chlamydia swabs self-collected today. TDaP given. Follow up in one week.  See orders  Lloyd Huger, CNM

## 2022-11-20 ENCOUNTER — Encounter: Payer: Self-pay | Admitting: Licensed Practical Nurse

## 2022-11-20 DIAGNOSIS — Z3480 Encounter for supervision of other normal pregnancy, unspecified trimester: Secondary | ICD-10-CM | POA: Diagnosis not present

## 2022-11-20 DIAGNOSIS — Z1379 Encounter for other screening for genetic and chromosomal anomalies: Secondary | ICD-10-CM | POA: Diagnosis not present

## 2022-11-20 LAB — CBC/D/PLT+RPR+RH+ABO+RUBIGG...
Antibody Screen: NEGATIVE
Basophils Absolute: 0 10*3/uL (ref 0.0–0.2)
Basos: 0 %
EOS (ABSOLUTE): 0.1 10*3/uL (ref 0.0–0.4)
Eos: 1 %
HCV Ab: NONREACTIVE
HIV Screen 4th Generation wRfx: NONREACTIVE
Hematocrit: 34.8 % (ref 34.0–46.6)
Hemoglobin: 11.4 g/dL (ref 11.1–15.9)
Hepatitis B Surface Ag: NEGATIVE
Immature Grans (Abs): 0 10*3/uL (ref 0.0–0.1)
Immature Granulocytes: 0 %
Lymphocytes Absolute: 1.6 10*3/uL (ref 0.7–3.1)
Lymphs: 15 %
MCH: 26.9 pg (ref 26.6–33.0)
MCHC: 32.8 g/dL (ref 31.5–35.7)
MCV: 82 fL (ref 79–97)
Monocytes Absolute: 0.7 10*3/uL (ref 0.1–0.9)
Monocytes: 7 %
Neutrophils Absolute: 8.2 10*3/uL — ABNORMAL HIGH (ref 1.4–7.0)
Neutrophils: 77 %
Platelets: 287 10*3/uL (ref 150–450)
RBC: 4.24 x10E6/uL (ref 3.77–5.28)
RDW: 15.1 % (ref 11.7–15.4)
RPR Ser Ql: NONREACTIVE
Rh Factor: POSITIVE
Rubella Antibodies, IGG: <0.9 index — ABNORMAL LOW (ref >0.99)
Varicella zoster IgG: 379 index (ref >165)
WBC: 10.6 10*3/uL (ref 3.4–10.8)

## 2022-11-20 LAB — HCV INTERPRETATION

## 2022-11-21 ENCOUNTER — Other Ambulatory Visit: Payer: Self-pay

## 2022-11-21 ENCOUNTER — Inpatient Hospital Stay
Admission: EM | Admit: 2022-11-21 | Discharge: 2022-11-24 | DRG: 806 | Disposition: A | Discharge status: Home / Self Care | Payer: Medicaid Other | Attending: Obstetrics | Admitting: Obstetrics

## 2022-11-21 ENCOUNTER — Encounter: Payer: Self-pay | Admitting: Obstetrics and Gynecology

## 2022-11-21 DIAGNOSIS — O9902 Anemia complicating childbirth: Secondary | ICD-10-CM | POA: Diagnosis not present

## 2022-11-21 DIAGNOSIS — O42913 Preterm premature rupture of membranes, unspecified as to length of time between rupture and onset of labor, third trimester: Principal | ICD-10-CM | POA: Diagnosis present

## 2022-11-21 DIAGNOSIS — O0933 Supervision of pregnancy with insufficient antenatal care, third trimester: Secondary | ICD-10-CM | POA: Diagnosis not present

## 2022-11-21 DIAGNOSIS — Z23 Encounter for immunization: Secondary | ICD-10-CM | POA: Diagnosis not present

## 2022-11-21 DIAGNOSIS — O42013 Preterm premature rupture of membranes, onset of labor within 24 hours of rupture, third trimester: Secondary | ICD-10-CM | POA: Diagnosis not present

## 2022-11-21 DIAGNOSIS — Z3480 Encounter for supervision of other normal pregnancy, unspecified trimester: Principal | ICD-10-CM

## 2022-11-21 DIAGNOSIS — D62 Acute posthemorrhagic anemia: Secondary | ICD-10-CM | POA: Diagnosis not present

## 2022-11-21 DIAGNOSIS — O4290 Premature rupture of membranes, unspecified as to length of time between rupture and onset of labor, unspecified weeks of gestation: Secondary | ICD-10-CM | POA: Diagnosis present

## 2022-11-21 DIAGNOSIS — Z3A36 36 weeks gestation of pregnancy: Secondary | ICD-10-CM

## 2022-11-21 LAB — MONITOR DRUG PROFILE 14(MW)
Amphetamine Scrn, Ur: NEGATIVE ng/mL
BARBITURATE SCREEN URINE: NEGATIVE ng/mL
BENZODIAZEPINE SCREEN, URINE: NEGATIVE ng/mL
Buprenorphine, Urine: NEGATIVE ng/mL
CANNABINOIDS UR QL SCN: NEGATIVE ng/mL
Cocaine (Metab) Scrn, Ur: NEGATIVE ng/mL
Creatinine(Crt), U: 169 mg/dL (ref 20.0–300.0)
Fentanyl, Urine: NEGATIVE pg/mL
Meperidine Screen, Urine: NEGATIVE ng/mL
Methadone Screen, Urine: NEGATIVE ng/mL
OXYCODONE+OXYMORPHONE UR QL SCN: NEGATIVE ng/mL
Opiate Scrn, Ur: NEGATIVE ng/mL
Ph of Urine: 5.6 (ref 4.5–8.9)
Phencyclidine Qn, Ur: NEGATIVE ng/mL
Propoxyphene Scrn, Ur: NEGATIVE ng/mL
SPECIFIC GRAVITY: 1.028
Tramadol Screen, Urine: NEGATIVE ng/mL

## 2022-11-21 LAB — COMPREHENSIVE METABOLIC PANEL
ALT: 9 U/L (ref 0–44)
AST: 15 U/L (ref 15–41)
Albumin: 3.1 g/dL — ABNORMAL LOW (ref 3.5–5.0)
Alkaline Phosphatase: 115 U/L (ref 38–126)
Anion gap: 7 (ref 5–15)
BUN: 13 mg/dL (ref 6–20)
CO2: 22 mmol/L (ref 22–32)
Calcium: 8.9 mg/dL (ref 8.9–10.3)
Chloride: 105 mmol/L (ref 98–111)
Creatinine, Ser: 0.52 mg/dL (ref 0.44–1.00)
GFR, Estimated: >60 mL/min (ref >60)
Glucose, Bld: 69 mg/dL — ABNORMAL LOW (ref 70–99)
Potassium: 3.7 mmol/L (ref 3.5–5.1)
Sodium: 134 mmol/L — ABNORMAL LOW (ref 135–145)
Total Bilirubin: 0.4 mg/dL (ref 0.3–1.2)
Total Protein: 6.6 g/dL (ref 6.5–8.1)

## 2022-11-21 LAB — CBC
HCT: 34.4 % — ABNORMAL LOW (ref 36.0–46.0)
Hemoglobin: 11.4 g/dL — ABNORMAL LOW (ref 12.0–15.0)
MCH: 27.2 pg (ref 26.0–34.0)
MCHC: 33.1 g/dL (ref 30.0–36.0)
MCV: 82.1 fL (ref 80.0–100.0)
Platelets: 271 10*3/uL (ref 150–400)
RBC: 4.19 MIL/uL (ref 3.87–5.11)
RDW: 15.5 % (ref 11.5–15.5)
WBC: 13.1 10*3/uL — ABNORMAL HIGH (ref 4.0–10.5)
nRBC: 0 % (ref 0.0–0.2)

## 2022-11-21 LAB — URINE CYTOLOGY ANCILLARY ONLY
Chlamydia: NEGATIVE
Comment: NEGATIVE
Comment: NORMAL
Neisseria Gonorrhea: NEGATIVE

## 2022-11-21 LAB — NICOTINE SCREEN, URINE: Cotinine Ql Scrn, Ur: NEGATIVE ng/mL

## 2022-11-21 LAB — PROTEIN / CREATININE RATIO, URINE
Creatinine, Urine: 156 mg/dL
Protein Creatinine Ratio: 0.22 mg/mg{Cre} — ABNORMAL HIGH (ref 0.00–0.15)
Total Protein, Urine: 35 mg/dL

## 2022-11-21 LAB — RUPTURE OF MEMBRANE (ROM)PLUS: Rom Plus: POSITIVE

## 2022-11-21 LAB — GROUP B STREP BY PCR: Group B strep by PCR: NEGATIVE

## 2022-11-21 MED ORDER — OXYTOCIN 10 UNIT/ML IJ SOLN
INTRAMUSCULAR | Status: AC
  Filled 2022-11-21: qty 2

## 2022-11-21 MED ORDER — FENTANYL CITRATE (PF) 100 MCG/2ML IJ SOLN: 50.0000 ug | INTRAMUSCULAR | Status: DC | PRN

## 2022-11-21 MED ORDER — LIDOCAINE HCL (PF) 1 % IJ SOLN: 30.0000 mL | INTRAMUSCULAR | Status: DC | PRN

## 2022-11-21 MED ORDER — LACTATED RINGERS IV SOLN: INTRAVENOUS | Status: DC

## 2022-11-21 MED ORDER — TERBUTALINE SULFATE 1 MG/ML IJ SOLN: 0.2500 mg | Freq: Once | INTRAMUSCULAR | Status: DC | PRN

## 2022-11-21 MED ORDER — OXYTOCIN-SODIUM CHLORIDE 30-0.9 UT/500ML-% IV SOLN
2.5000 [IU]/h | INTRAVENOUS | Status: DC
  Filled 2022-11-21: qty 500

## 2022-11-21 MED ORDER — OXYTOCIN BOLUS FROM INFUSION: 333.0000 mL | Freq: Once | INTRAVENOUS | Status: DC

## 2022-11-21 MED ORDER — AMMONIA AROMATIC IN INHA
RESPIRATORY_TRACT | Status: AC
  Filled 2022-11-21: qty 10

## 2022-11-21 MED ORDER — OXYTOCIN-SODIUM CHLORIDE 30-0.9 UT/500ML-% IV SOLN
1.0000 m[IU]/min | INTRAVENOUS | Status: DC
  Administered 2022-11-22: 8 m[IU]/min via INTRAVENOUS
  Administered 2022-11-22: 2 m[IU]/min via INTRAVENOUS
  Filled 2022-11-21: qty 1000

## 2022-11-21 MED ORDER — LACTATED RINGERS IV SOLN
500.0000 mL | INTRAVENOUS | Status: DC | PRN
  Administered 2022-11-21 – 2022-11-22 (×2): 500 mL via INTRAVENOUS

## 2022-11-21 MED ORDER — MISOPROSTOL 25 MCG QUARTER TABLET
25.0000 ug | ORAL_TABLET | Freq: Once | ORAL | Status: AC
  Administered 2022-11-21: 25 ug via ORAL
  Filled 2022-11-21: qty 1

## 2022-11-21 MED ORDER — PENICILLIN G POT IN DEXTROSE 60000 UNIT/ML IV SOLN: 3.0000 10*6.[IU] | INTRAVENOUS | Status: DC

## 2022-11-21 MED ORDER — MISOPROSTOL 25 MCG QUARTER TABLET
25.0000 ug | ORAL_TABLET | Freq: Once | ORAL | Status: AC
  Administered 2022-11-21: 25 ug via VAGINAL
  Filled 2022-11-21: qty 1

## 2022-11-21 MED ORDER — MISOPROSTOL 200 MCG PO TABS
ORAL_TABLET | ORAL | Status: AC
  Administered 2022-11-21: 50 ug via ORAL
  Filled 2022-11-21: qty 4

## 2022-11-21 MED ORDER — SOD CITRATE-CITRIC ACID 500-334 MG/5ML PO SOLN: 30.0000 mL | ORAL | Status: DC | PRN

## 2022-11-21 MED ORDER — LIDOCAINE HCL (PF) 1 % IJ SOLN
INTRAMUSCULAR | Status: AC
  Filled 2022-11-21: qty 30

## 2022-11-21 MED ORDER — ONDANSETRON HCL 4 MG/2ML IJ SOLN: 4.0000 mg | Freq: Four times a day (QID) | INTRAMUSCULAR | Status: DC | PRN

## 2022-11-21 MED ORDER — BETAMETHASONE SOD PHOS & ACET 6 (3-3) MG/ML IJ SUSP
12.0000 mg | Freq: Once | INTRAMUSCULAR | Status: AC
  Administered 2022-11-21: 12 mg via INTRAMUSCULAR
  Filled 2022-11-21: qty 5

## 2022-11-21 MED ORDER — MISOPROSTOL 50MCG HALF TABLET
50.0000 ug | ORAL_TABLET | ORAL | Status: DC | PRN
  Filled 2022-11-21: qty 1

## 2022-11-21 MED ORDER — SODIUM CHLORIDE 0.9 % IV SOLN
5.0000 10*6.[IU] | Freq: Once | INTRAVENOUS | Status: AC
  Administered 2022-11-21: 5 10*6.[IU] via INTRAVENOUS
  Filled 2022-11-21: qty 5

## 2022-11-21 NOTE — Progress Notes (Signed)
Subjective:  Pt feeling contractions, but not too strong yet. Her mother is her side.   Objective:   Vitals: Blood pressure 134/82, pulse 78, temperature 99.3 F (37.4 C), temperature source Axillary, resp. rate 18, height 5\' 4"  (1.626 m), weight 91.6 kg, last menstrual period 06/18/2022. General: NAD Abdomen:non tender  Cervical Exam:  Dilation: 2 Effacement (%): 60 Cervical Position: Middle Station: -1 Presentation: Vertex Exam by:: Karen [name], CNM  FHT: baseline 130, moderate variability, pos accel, neg decel  Toco:irregular   Results for orders placed or performed during the hospital encounter of 11/21/22 (from the past 24 hour(s))  CBC     Status: Abnormal   Collection Time: 11/21/22  3:11 PM  Result Value Ref Range   WBC 13.1 (H) 4.0 - 10.5 K/uL   RBC 4.19 3.87 - 5.11 MIL/uL   Hemoglobin 11.4 (L) 12.0 - 15.0 g/dL   HCT 34.4 (L) 36.0 - 46.0 %   MCV 82.1 80.0 - 100.0 fL   MCH 27.2 26.0 - 34.0 pg   MCHC 33.1 30.0 - 36.0 g/dL   RDW 15.5 11.5 - 15.5 %   Platelets 271 150 - 400 K/uL   nRBC 0.0 0.0 - 0.2 %  Comprehensive metabolic panel     Status: Abnormal   Collection Time: 11/21/22  3:11 PM  Result Value Ref Range   Sodium 134 (L) 135 - 145 mmol/L   Potassium 3.7 3.5 - 5.1 mmol/L   Chloride 105 98 - 111 mmol/L   CO2 22 22 - 32 mmol/L   Glucose, Bld 69 (L) 70 - 99 mg/dL   BUN 13 6 - 20 mg/dL   Creatinine, Ser 0.52 0.44 - 1.00 mg/dL   Calcium 8.9 8.9 - 10.3 mg/dL   Total Protein 6.6 6.5 - 8.1 g/dL   Albumin 3.1 (L) 3.5 - 5.0 g/dL   AST 15 15 - 41 U/L   ALT 9 0 - 44 U/L   Alkaline Phosphatase 115 38 - 126 U/L   Total Bilirubin 0.4 0.3 - 1.2 mg/dL   GFR, Estimated >60 >60 mL/min   Anion gap 7 5 - 15  Protein / creatinine ratio, urine     Status: Abnormal   Collection Time: 11/21/22  3:15 PM  Result Value Ref Range   Creatinine, Urine 156 mg/dL   Total Protein, Urine 35 mg/dL   Protein Creatinine Ratio 0.22 (H) 0.00 - 0.15 mg/mg[Cre]  ROM Plus ([facility] only)      Status: None   Collection Time: 11/21/22  3:15 PM  Result Value Ref Range   Rom Plus POSITIVE   Type and screen [facility]     Status: None   Collection Time: 11/21/22  4:17 PM  Result Value Ref Range   ABO/RH(D) O POS    Antibody Screen NEG    Sample Expiration      11/24/2022,2359 Performed at [facility], [address] [place], [place] [address]   Group B strep by PCR     Status: None   Collection Time: 11/21/22  4:26 PM   Specimen: Vaginal/Rectal; Genital  Result Value Ref Range   Group B strep by PCR NEGATIVE NEGATIVE  ABO/Rh     Status: None   Collection Time: 11/21/22  4:26 PM  Result Value Ref Range   ABO/RH(D)      O POS Performed at [facility], [address], [place], [place] [address]     Assessment:   22 y.o.  G1P0 [redacted]w[redacted]d admitted for PROM   Plan:   1) Labor -will do Cytotec 88mcg oral every 4 hours until 3cm   2) Fetus - category I tracing   3) GBS negative, SROM with mec at 1400  4) Pain management: aware of all pain management options, will ask when desired   Karen [name], [facility]  11/21/2022 10:03 PM

## 2022-11-21 NOTE — Progress Notes (Signed)
Reviewed with PNP on call pt's history, currently 36wks 4day based off an recent US. Given that the pt does not have good dating and could be earlier than 36 wks pt should be offered betamethasone.   Offered betamethasone to pt for lung maturity. Pt agreeable to plan. Betamethasone ordered Roberto Scales, [facility]  11/21/2022 10:17 PM

## 2022-11-21 NOTE — OB Triage Note (Signed)
Pt G1P0 [redacted]w[redacted]d presents for lower abd pressure that started yesterday and got worse after having a "gush" of fluid around 1400. Pt reports +FM. Denies bleeding.

## 2022-11-21 NOTE — H&P (Cosign Needed)
OB History & Physical   History of Present Illness:  Chief Complaint:   HPI:  Karen [name] is a 22 y.o. G1P0 female at [redacted]w[redacted]d dated by 36wk [place].  She presents to L&D for contractions and experiencing a gush of fluid at 1400. At 1400 she felt a gush of fluid come out, shortly after contractions started. The fluid was clear. She has felt little gushes of fluid since then.  She endorse +FM.   RN Noted light mec stained fluid on the bed and her glove after examing pt.   Niah's first prenatal appointment was on 11/19/22.  She had an [place] on 11/19/22 that showed a  SIUP at 36wks2days.  When asked why she had not received prenatal care, Gracelin stated "I do not know", it is not clear if Lilliemae was aware of her pregnancy, she started to feel FM only a few weeks ago. She states she did not know she was pregnant.  She did have a positive pregnancy test at an Urgent care on 07/03/2023.    Pregnancy Issues: 1. Limited prenatal, first visit 1/22 2. PPROM   Maternal Medical History:   Past Medical History:  Diagnosis Date   AKI (acute kidney injury) ([place]) 11/23/2020   r/t dehydration - covid infection    COVID    Hepatic vein stenosis    Overweight(278.02) 03/03/2013    Past Surgical History:  Procedure Laterality Date   KNEE ARTHROSCOPY WITH ANTERIOR CRUCIATE LIGAMENT (ACL) REPAIR Left 04/13/2021   Procedure: KNEE ARTHROSCOPY WITH ANTERIOR CRUCIATE LIGAMENT (ACL) REPAIR WITH  LATERAL MENISECTOMY and DRILLING/MICROFRACTURE;  Surgeon: Marchia Bond, MD;  Location: [facility];  Service: Orthopedics;  Laterality: Left;   KNEE ARTHROSCOPY WITH MENISCAL REPAIR Left 04/13/2021   Procedure: KNEE ARTHROSCOPY WITH MENISCAL Root Repair;  Surgeon: Marchia Bond, MD;  Location: [facility];  Service: Orthopedics;  Laterality: Left;   WISDOM TOOTH EXTRACTION      No Known Allergies  Prior to Admission medications   Medication Sig Start Date End Date Taking? Authorizing Provider   Prenatal Vit-Fe Fumarate-FA (MULTIVITAMIN-PRENATAL) 27-0.8 MG TABS tablet Take 1 tablet by mouth daily at 12 noon.   Yes [provider]     Prenatal care site: [facility] x 1 appointment   Social History: She  reports that she has never smoked. She has never used smokeless tobacco. She reports that she does not drink alcohol and does not use drugs.  Family History: family history includes Diabetes in her father; Healthy in her maternal grandfather, maternal grandmother, paternal grandfather, paternal grandmother, sister, and sister; Hypertension in her father; Thyroid disease in her mother.   Review of Systems: A full review of systems was performed and negative except as noted in the HPI.     Physical Exam:  Vital Signs: BP (!) 144/80   Pulse 78   Temp 98.8 F (37.1 C) (Oral)   Resp 16   Ht 5\' 4"  (1.626 m)   Wt 91.6 kg   LMP 06/18/2022 (Exact Date)   BMI 34.67 kg/m  General: no acute distress.  HEENT: normocephalic, atraumatic Heart: regular rate & rhythm.  No murmurs/rubs/gallops Lungs: clear to auscultation bilaterally, normal respiratory effort Abdomen: soft, gravid, non-tender;  EFW: 6.5lbs  Pelvic:   External: Normal external female genitalia  Cervix: Dilation: 1.5 / Effacement (%): 50 / Station: -2    Extremities: non-tender, symmetric, no edema bilaterally.    Neurologic: Alert & oriented x 3.  Results for orders placed or performed during the hospital encounter of 11/21/22 (from the past 24 hour(s))  CBC     Status: Abnormal   Collection Time: 11/21/22  3:11 PM  Result Value Ref Range   WBC 13.1 (H) 4.0 - 10.5 K/uL   RBC 4.19 3.87 - 5.11 MIL/uL   Hemoglobin 11.4 (L) 12.0 - 15.0 g/dL   HCT 34.4 (L) 36.0 - 46.0 %   MCV 82.1 80.0 - 100.0 fL   MCH 27.2 26.0 - 34.0 pg   MCHC 33.1 30.0 - 36.0 g/dL   RDW 15.5 11.5 - 15.5 %   Platelets 271 150 - 400 K/uL   nRBC 0.0 0.0 - 0.2 %    Pertinent Results:  Prenatal Labs: Blood type/Rh O +   Antibody  screen neg  Rubella Non Immune  Varicella Immune  RPR NR  HBsAg Neg  HIV NR  GC neg  Chlamydia neg  Genetic screening negative  1 hour GTT Never collected   3 hour GTT   GBS neg   WNI:OEVOJJKK 125, moderate variability, pos accel, neg decel  TOCO:q 4-6 mins  SVE:  Dilation: 1.5 / Effacement (%): 50 / Station: -2    Cephalic by leopolds  No results found.  Assessment:  Karen [name] is a 22 y.o. G1P0 female at [redacted]w[redacted]d with PPROM .   Plan:  Admit to Labor & Delivery Cytotec ordered  CBC, T&S, Clrs, IVF GBS  collected, will do PCN for now, cancel if results are negative.  SROM for mec at 1400  Consents obtained. Continuous efm/toco Pain management: aware of all pain management options, will ask when desired  Dr Marcelline Mates aware of admission and plan  ----- Roberto Scales, [place]

## 2022-11-21 NOTE — Telephone Encounter (Signed)
Spoke with patient who stated her contractions were closer, patient headed to L/D

## 2022-11-22 ENCOUNTER — Ambulatory Visit: Admission: RE | Admit: 2022-11-22 | Payer: Medicaid Other | Source: Ambulatory Visit

## 2022-11-22 ENCOUNTER — Inpatient Hospital Stay: Payer: Medicaid Other | Admitting: Certified Registered"

## 2022-11-22 ENCOUNTER — Encounter: Payer: Self-pay | Admitting: Obstetrics

## 2022-11-22 DIAGNOSIS — Z3A36 36 weeks gestation of pregnancy: Secondary | ICD-10-CM

## 2022-11-22 DIAGNOSIS — O42013 Preterm premature rupture of membranes, onset of labor within 24 hours of rupture, third trimester: Secondary | ICD-10-CM | POA: Diagnosis not present

## 2022-11-22 DIAGNOSIS — O4290 Premature rupture of membranes, unspecified as to length of time between rupture and onset of labor, unspecified weeks of gestation: Secondary | ICD-10-CM | POA: Diagnosis not present

## 2022-11-22 DIAGNOSIS — O0933 Supervision of pregnancy with insufficient antenatal care, third trimester: Secondary | ICD-10-CM | POA: Diagnosis not present

## 2022-11-22 LAB — HEMOGLOBIN A1C
Est. average glucose Bld gHb Est-mCnc: 117 mg/dL
Hgb A1c MFr Bld: 5.7 % — ABNORMAL HIGH (ref 4.8–5.6)

## 2022-11-22 LAB — ABO/RH: ABO/RH(D): O POS

## 2022-11-22 LAB — TYPE AND SCREEN
ABO/RH(D): O POS
Antibody Screen: NEGATIVE

## 2022-11-22 LAB — SPECIMEN STATUS REPORT

## 2022-11-22 MED ORDER — PRENATAL MULTIVITAMIN CH
1.0000 | ORAL_TABLET | Freq: Every day | ORAL | Status: DC
  Administered 2022-11-23 – 2022-11-24 (×2): 1 via ORAL
  Filled 2022-11-22 (×2): qty 1

## 2022-11-22 MED ORDER — ACETAMINOPHEN 325 MG PO TABS
ORAL_TABLET | ORAL | Status: AC
  Filled 2022-11-22: qty 2

## 2022-11-22 MED ORDER — DIPHENHYDRAMINE HCL 25 MG PO CAPS: 25.0000 mg | ORAL_CAPSULE | ORAL | Status: DC | PRN

## 2022-11-22 MED ORDER — DIPHENHYDRAMINE HCL 50 MG/ML IJ SOLN
25.0000 mg | INTRAMUSCULAR | Status: DC | PRN
  Administered 2022-11-22: 25 mg via INTRAVENOUS
  Filled 2022-11-22: qty 1

## 2022-11-22 MED ORDER — EPHEDRINE 5 MG/ML INJ: 10.0000 mg | INTRAVENOUS | Status: DC | PRN

## 2022-11-22 MED ORDER — METHYLERGONOVINE MALEATE 0.2 MG PO TABS: 0.2000 mg | ORAL_TABLET | ORAL | Status: DC | PRN

## 2022-11-22 MED ORDER — OXYCODONE-ACETAMINOPHEN 5-325 MG PO TABS: 1.0000 | ORAL_TABLET | ORAL | Status: DC | PRN

## 2022-11-22 MED ORDER — PHENYLEPHRINE 80 MCG/ML (10ML) SYRINGE FOR IV PUSH (FOR BLOOD PRESSURE SUPPORT): 80.0000 ug | PREFILLED_SYRINGE | INTRAVENOUS | Status: DC | PRN

## 2022-11-22 MED ORDER — ZOLPIDEM TARTRATE 5 MG PO TABS: 5.0000 mg | ORAL_TABLET | Freq: Every evening | ORAL | Status: DC | PRN

## 2022-11-22 MED ORDER — ONDANSETRON HCL 4 MG PO TABS: 4.0000 mg | ORAL_TABLET | ORAL | Status: DC | PRN

## 2022-11-22 MED ORDER — WITCH HAZEL-GLYCERIN EX PADS: MEDICATED_PAD | CUTANEOUS | Status: DC | PRN

## 2022-11-22 MED ORDER — LIDOCAINE HCL (PF) 1 % IJ SOLN
INTRAMUSCULAR | Status: DC | PRN
  Administered 2022-11-22: 3 mL via SUBCUTANEOUS

## 2022-11-22 MED ORDER — BENZOCAINE-MENTHOL 20-0.5 % EX AERO
INHALATION_SPRAY | CUTANEOUS | Status: AC
  Filled 2022-11-22: qty 56

## 2022-11-22 MED ORDER — DIPHENHYDRAMINE HCL 50 MG/ML IJ SOLN: 12.5000 mg | INTRAMUSCULAR | Status: DC | PRN

## 2022-11-22 MED ORDER — FENTANYL-BUPIVACAINE-NACL 0.5-0.125-0.9 MG/250ML-% EP SOLN
EPIDURAL | Status: AC
  Filled 2022-11-22: qty 250

## 2022-11-22 MED ORDER — IBUPROFEN 600 MG PO TABS
ORAL_TABLET | ORAL | Status: AC
  Administered 2022-11-22: 600 mg
  Filled 2022-11-22: qty 1

## 2022-11-22 MED ORDER — LACTATED RINGERS IV SOLN
500.0000 mL | Freq: Once | INTRAVENOUS | Status: AC
  Administered 2022-11-22: 500 mL via INTRAVENOUS

## 2022-11-22 MED ORDER — OXYTOCIN-SODIUM CHLORIDE 30-0.9 UT/500ML-% IV SOLN: 2.5000 [IU]/h | INTRAVENOUS | Status: DC | PRN

## 2022-11-22 MED ORDER — FENTANYL-BUPIVACAINE-NACL 0.5-0.125-0.9 MG/250ML-% EP SOLN
12.0000 mL/h | EPIDURAL | Status: DC | PRN
  Administered 2022-11-22: 12 mL/h via EPIDURAL

## 2022-11-22 MED ORDER — DOCUSATE SODIUM 100 MG PO CAPS
100.0000 mg | ORAL_CAPSULE | Freq: Two times a day (BID) | ORAL | Status: DC
  Administered 2022-11-23 – 2022-11-24 (×3): 100 mg via ORAL
  Filled 2022-11-22 (×3): qty 1

## 2022-11-22 MED ORDER — IBUPROFEN 600 MG PO TABS
600.0000 mg | ORAL_TABLET | Freq: Four times a day (QID) | ORAL | Status: DC
  Administered 2022-11-23 – 2022-11-24 (×5): 600 mg via ORAL
  Filled 2022-11-22 (×6): qty 1

## 2022-11-22 MED ORDER — COCONUT OIL OIL
1.0000 | TOPICAL_OIL | Status: DC | PRN
  Filled 2022-11-22: qty 7.5

## 2022-11-22 MED ORDER — ACETAMINOPHEN 325 MG PO TABS
650.0000 mg | ORAL_TABLET | ORAL | Status: DC | PRN
  Administered 2022-11-22: 650 mg via ORAL

## 2022-11-22 MED ORDER — SIMETHICONE 80 MG PO CHEW: 80.0000 mg | CHEWABLE_TABLET | ORAL | Status: DC | PRN

## 2022-11-22 MED ORDER — BUPIVACAINE HCL (PF) 0.25 % IJ SOLN
INTRAMUSCULAR | Status: DC | PRN
  Administered 2022-11-22 (×2): 3 mL via EPIDURAL

## 2022-11-22 MED ORDER — SODIUM CHLORIDE 0.9 % IV SOLN: 25.0000 mg | INTRAVENOUS | Status: DC | PRN

## 2022-11-22 MED ORDER — METHYLERGONOVINE MALEATE 0.2 MG/ML IJ SOLN: 0.2000 mg | INTRAMUSCULAR | Status: DC | PRN

## 2022-11-22 MED ORDER — DIPHENHYDRAMINE HCL 25 MG PO CAPS: 25.0000 mg | ORAL_CAPSULE | Freq: Four times a day (QID) | ORAL | Status: DC | PRN

## 2022-11-22 MED ORDER — BENZOCAINE-MENTHOL 20-0.5 % EX AERO: 1.0000 | INHALATION_SPRAY | CUTANEOUS | Status: DC | PRN

## 2022-11-22 MED ORDER — LIDOCAINE-EPINEPHRINE (PF) 1.5 %-1:200000 IJ SOLN
INTRAMUSCULAR | Status: DC | PRN
  Administered 2022-11-22: 3 mL via EPIDURAL

## 2022-11-22 MED ORDER — MEASLES, MUMPS & RUBELLA VAC IJ SOLR
0.5000 mL | Freq: Once | INTRAMUSCULAR | Status: AC
  Administered 2022-11-24: 0.5 mL via SUBCUTANEOUS
  Filled 2022-11-22 (×2): qty 0.5

## 2022-11-22 MED ORDER — ONDANSETRON HCL 4 MG/2ML IJ SOLN: 4.0000 mg | INTRAMUSCULAR | Status: DC | PRN

## 2022-11-22 NOTE — Progress Notes (Signed)
Subjective:  Pt starting to feel more intense contractions.   Objective:   Vitals: Blood pressure 129/86, pulse 81, temperature 98.6 F (37 C), temperature source Axillary, resp. rate 18, height 5\' 4"  (1.626 m), weight 91.6 kg, last menstrual period 06/18/2022. General: NAD Abdomen:Non tender Cervical Exam:  Dilation: 3 Effacement (%): 80 Cervical Position: Middle Station: 0 Presentation: Vertex Exam by:: Logan Baltimore, CNM  FHT: baseline 120, moderate variability, pos accel, neg decel  Toco:irregular   Results for orders placed or performed during the hospital encounter of 11/21/22 (from the past 24 hour(s))  CBC     Status: Abnormal   Collection Time: 11/21/22  3:11 PM  Result Value Ref Range   WBC 13.1 (H) 4.0 - 10.5 K/uL   RBC 4.19 3.87 - 5.11 MIL/uL   Hemoglobin 11.4 (L) 12.0 - 15.0 g/dL   HCT 34.4 (L) 36.0 - 46.0 %   MCV 82.1 80.0 - 100.0 fL   MCH 27.2 26.0 - 34.0 pg   MCHC 33.1 30.0 - 36.0 g/dL   RDW 15.5 11.5 - 15.5 %   Platelets 271 150 - 400 K/uL   nRBC 0.0 0.0 - 0.2 %  Comprehensive metabolic panel     Status: Abnormal   Collection Time: 11/21/22  3:11 PM  Result Value Ref Range   Sodium 134 (L) 135 - 145 mmol/L   Potassium 3.7 3.5 - 5.1 mmol/L   Chloride 105 98 - 111 mmol/L   CO2 22 22 - 32 mmol/L   Glucose, Bld 69 (L) 70 - 99 mg/dL   BUN 13 6 - 20 mg/dL   Creatinine, Ser 0.52 0.44 - 1.00 mg/dL   Calcium 8.9 8.9 - 10.3 mg/dL   Total Protein 6.6 6.5 - 8.1 g/dL   Albumin 3.1 (L) 3.5 - 5.0 g/dL   AST 15 15 - 41 U/L   ALT 9 0 - 44 U/L   Alkaline Phosphatase 115 38 - 126 U/L   Total Bilirubin 0.4 0.3 - 1.2 mg/dL   GFR, Estimated >60 >60 mL/min   Anion gap 7 5 - 15  Protein / creatinine ratio, urine     Status: Abnormal   Collection Time: 11/21/22  3:15 PM  Result Value Ref Range   Creatinine, Urine 156 mg/dL   Total Protein, Urine 35 mg/dL   Protein Creatinine Ratio 0.22 (H) 0.00 - 0.15 mg/mg[Cre]  ROM Plus ([facility] only)     Status: None   Collection  Time: 11/21/22  3:15 PM  Result Value Ref Range   Rom Plus POSITIVE   Type and screen [facility]     Status: None   Collection Time: 11/21/22  4:17 PM  Result Value Ref Range   ABO/RH(D) O POS    Antibody Screen NEG    Sample Expiration      11/24/2022,2359 Performed at [facility], [address] [place], [place] [address]   Group B strep by PCR     Status: None   Collection Time: 11/21/22  4:26 PM   Specimen: Vaginal/Rectal; Genital  Result Value Ref Range   Group B strep by PCR NEGATIVE NEGATIVE  ABO/Rh     Status: None   Collection Time: 11/21/22  4:26 PM  Result Value Ref Range   ABO/RH(D)      O POS Performed at [facility], [address], [place], [place] [address]     Assessment:   22 y.o. G1P0 [redacted]w[redacted]d admitted for [facility]  Plan:   1) Labor -received Cytotec x 2, will start Pitocin   2) Fetus - category 1 tracing   3) GBS negative, SROM with mec at 1400   4) Pain management: aware of all pain management options, will ask when desired   Caytlin Better, [facility]  11/22/2022 3:38 AM

## 2022-11-22 NOTE — Anesthesia Procedure Notes (Signed)
Epidural Patient location during procedure: OB Start time: 11/22/2022 9:20 AM End time: 11/22/2022 9:25 AM  Staffing Anesthesiologist: Piscitello, Precious Haws, MD Resident/CRNA: Hedda Slade, CRNA Performed: resident/CRNA   Preanesthetic Checklist Completed: patient identified, IV checked, site marked, risks and benefits discussed, surgical consent, monitors and equipment checked, pre-op evaluation and timeout performed  Epidural Patient position: sitting Prep: ChloraPrep Patient monitoring: heart rate, continuous pulse ox and blood pressure Approach: midline Location: L3-L4 Injection technique: LOR saline  Needle:  Needle type: Tuohy  Needle gauge: 17 G Needle length: 9 cm and 9 Needle insertion depth: 7 cm Catheter type: closed end flexible Catheter size: 19 Gauge Catheter at skin depth: 12 cm Test dose: negative and 1.5% lidocaine with Epi 1:200 K  Assessment Sensory level: T10 Events: blood not aspirated, no cerebrospinal fluid, injection not painful, no injection resistance, no paresthesia and negative IV test  Additional Notes 1 attempt Pt. Evaluated and documentation done after procedure finished. Patient identified. Risks/Benefits/Options discussed with patient including but not limited to bleeding, infection, nerve damage, paralysis, failed block, incomplete pain control, headache, blood pressure changes, nausea, vomiting, reactions to medication both or allergic, itching and postpartum back pain. Confirmed with bedside nurse the patient's most recent platelet count. Confirmed with patient that they are not currently taking any anticoagulation, have any bleeding history or any family history of bleeding disorders. Patient expressed understanding and wished to proceed. All questions were answered. Sterile technique was used throughout the entire procedure. Please see nursing notes for vital signs. Test dose was given through epidural catheter and negative prior to continuing  to dose epidural or start infusion. Warning signs of high block given to the patient including shortness of breath, tingling/numbness in hands, complete motor block, or any concerning symptoms with instructions to call for help. Patient was given instructions on fall risk and not to get out of bed. All questions and concerns addressed with instructions to call with any issues or inadequate analgesia.    Patient tolerated the insertion well without immediate complications.Reason for block:procedure for pain

## 2022-11-22 NOTE — Anesthesia Preprocedure Evaluation (Signed)
Anesthesia Evaluation  Patient identified by MRN, date of birth, ID band Patient awake    Reviewed: Allergy & Precautions, H&P , NPO status , Patient's Chart, lab work & pertinent test results  Airway Mallampati: II       Dental no notable dental hx. (+) Teeth Intact   Pulmonary    Pulmonary exam normal        Cardiovascular Normal cardiovascular exam     Neuro/Psych negative neurological ROS  negative psych ROS   GI/Hepatic negative GI ROS, Neg liver ROS,,,  Endo/Other  negative endocrine ROS    Renal/GU negative Renal ROS  negative genitourinary   Musculoskeletal   Abdominal   Peds  Hematology negative hematology ROS (+)   Anesthesia Other Findings   Reproductive/Obstetrics (+) Pregnancy                             Anesthesia Physical Anesthesia Plan  ASA: 2  Anesthesia Plan: Epidural   Post-op Pain Management:    Induction:   PONV Risk Score and Plan:   Airway Management Planned:   Additional Equipment:   Intra-op Plan:   Post-operative Plan:   Informed Consent: I have reviewed the patients History and Physical, chart, labs and discussed the procedure including the risks, benefits and alternatives for the proposed anesthesia with the patient or authorized representative who has indicated his/her understanding and acceptance.     Dental Advisory Given  Plan Discussed with: Anesthesiologist and CRNA  Anesthesia Plan Comments:        Anesthesia Quick Evaluation

## 2022-11-22 NOTE — Progress Notes (Signed)
LABOR NOTE   SUBJECTIVE:   Karen [name] is a 22 y.o.  G1P0  at [redacted]w[redacted]d whose labor is being induced for PPROM. She is on Pitocin at 8 mU/min and is having more painful contractions. She would like a cervical exam and is considering an epidural for pain management. Difficulty tracing contractions d/t movement.  Analgesia: Labor support without medications and desires epidural  OBJECTIVE:  BP 138/79 (BP Location: Right Arm)   Pulse 83   Temp 98.2 F (36.8 C) (Oral)   Resp 16   Ht 5\' 4"  (1.626 m)   Wt 91.6 kg   LMP 06/18/2022 (Exact Date)   BMI 34.67 kg/m  No intake/output data recorded.  SVE:   Dilation: 3.5 Effacement (%): 80 Station: -1 Exam by:: Lauramae Kneisley CNM CONTRACTIONS: regular, every 4-5 minutes FHR: Fetal heart tracing reviewed. Baseline: 115 Variability: moderate Accelerations: present Decelerations:none Category 1  Labs: Lab Results  Component Value Date   WBC 13.1 (H) 11/21/2022   HGB 11.4 (L) 11/21/2022   HCT 34.4 (L) 11/21/2022   MCV 82.1 11/21/2022   PLT 271 11/21/2022    ASSESSMENT: 1) Induction of labor due to PPROM,  progressing well on pitocin     Coping: well, desires epidural     Membranes: ruptured, meconium   Principal Problem:   PROM (premature rupture of membranes)   PLAN: Epidural for pain management Titrate Pitocin to adequate contractions Anticipate NSVD  Lloyd Huger, CNM 11/22/2022 8:50 AM

## 2022-11-22 NOTE — Progress Notes (Signed)
LABOR NOTE   SUBJECTIVE:   Karen [name] is a 22 y.o.  G1P0  at [redacted]w[redacted]d whose labor is being induced for PPROM with meconium. She is receiving Pitocin at 18 mU/min. She is comfortable with her epidural  Analgesia: Epidural  OBJECTIVE:  BP (!) 113/43   Pulse 67   Temp 98.4 F (36.9 C)   Resp 16   Ht 5\' 4"  (1.626 m)   Wt 91.6 kg   LMP 06/18/2022 (Exact Date)   SpO2 99%   BMI 34.67 kg/m  Total I/O In: -  Out: 550 [Urine:550]  SVE:   Dilation: 5 Effacement (%): 90 Station: 0 Exam by:: Aminah Zabawa CNM CONTRACTIONS: regular, every 2-4 minutes FHR: Fetal heart tracing reviewed. Baseline: 120 Variability: moderate Accelerations: present Decelerations:occasional late/variable Category 1/2  Labs: Lab Results  Component Value Date   WBC 13.1 (H) 11/21/2022   HGB 11.4 (L) 11/21/2022   HCT 34.4 (L) 11/21/2022   MCV 82.1 11/21/2022   PLT 271 11/21/2022    ASSESSMENT: 1) Induction of labor due to PPROM,  progressing well on pitocin     Coping: well. Partner at bedside     Membranes: ruptured, meconium       Principal Problem:   PROM (premature rupture of membranes)   PLAN: Continue present management Anticipate NSVD Dr. Marcelline Mates updated  Lloyd Huger, CNM 11/22/2022 1:06 PM

## 2022-11-22 NOTE — Discharge Summary (Cosign Needed Addendum)
Postpartum Discharge Summary  Date of Service updated 11/24/2022     Patient Name: Karen [name] DOB: 07/27/2001 MRN: [id number]  Date of admission: 11/21/2022 Delivery date:11/22/2022  Delivering provider: Lurlean Horns  Date of discharge: 11/24/2022  Admitting diagnosis: PROM (premature rupture of membranes) [O42.90] Intrauterine pregnancy: [redacted]w[redacted]d     Secondary diagnosis:  Principal Problem:   PROM (premature rupture of membranes)  Additional problems: Limited prenatal care    Discharge diagnosis: Preterm Pregnancy Delivered                                              Post partum procedures: NA Augmentation: Pitocin and Cytotec Complications: None  Hospital course: Onset of Labor With Vaginal Delivery      22 y.o. yo G1P0 at [redacted]w[redacted]d was admitted in Latent Labor on 11/21/2022. Labor course was complicated by meconium-stained fluid.  Membrane Rupture Time/Date: 2:00 PM ,11/21/2022   Delivery Method:Vaginal, Spontaneous  Episiotomy: None  Lacerations:  None  Patient had a postpartum course complicated by NA.  She is ambulating, tolerating a regular diet, passing flatus, and urinating well. Breastfeeding independently. Patient is discharged home in stable condition on 11/24/22.  Newborn Data: Birth date:11/22/2022  Birth time:8:45 PM  Gender:Female  Living status:Living  Apgars:8 ,9  Weight:2690 g   Magnesium Sulfate received: No BMZ received: No Rhophylac:N/A TGG:YIRSWNI  T-DaP:Given prenatally Flu: ordered  Transfusion:No  Physical exam  Vitals:   11/23/22 1700 11/23/22 1950 11/23/22 2350 11/24/22 0821  BP: 122/84 130/81 125/86 111/76  Pulse: 90 85 66 78  Resp: 18 18 18 18   Temp: 98.6 F (37 C) 98.9 F (37.2 C) 98.4 F (36.9 C) 97.9 F (36.6 C)  TempSrc: Oral Oral Oral Oral  SpO2: 99% 100% 99% 98%  Weight:      Height:       General: alert Breasts: no redness or masses, nipples intact bilaterally  Lochia: appropriate Uterine Fundus:  firm Incision: N/A DVT Evaluation: No evidence of DVT seen on physical exam. Labs: Lab Results  Component Value Date   WBC 13.1 (H) 11/23/2022   HGB 8.9 (L) 11/23/2022   HCT 27.3 (L) 11/23/2022   MCV 81.7 11/23/2022   PLT 211 11/23/2022      Latest Ref Rng & Units 11/21/2022    3:11 PM  CMP  Glucose 70 - 99 mg/dL 69   BUN 6 - 20 mg/dL 13   Creatinine 0.44 - 1.00 mg/dL 0.52   Sodium 135 - 145 mmol/L 134   Potassium 3.5 - 5.1 mmol/L 3.7   Chloride 98 - 111 mmol/L 105   CO2 22 - 32 mmol/L 22   Calcium 8.9 - 10.3 mg/dL 8.9   Total Protein 6.5 - 8.1 g/dL 6.6   Total Bilirubin 0.3 - 1.2 mg/dL 0.4   Alkaline Phos 38 - 126 U/L 115   AST 15 - 41 U/L 15   ALT 0 - 44 U/L 9    Edinburgh Score:    11/23/2022    8:56 AM  Edinburgh Postnatal Depression Scale Screening Tool  I have been able to laugh and see the funny side of things. 0  I have looked forward with enjoyment to things. 0  I have blamed myself unnecessarily when things went wrong. 3  I have been anxious or worried for no good reason. 0  I  have felt scared or panicky for no good reason. 0  Things have been getting on top of me. 0  I have been so unhappy that I have had difficulty sleeping. 0  I have felt sad or miserable. 0  I have been so unhappy that I have been crying. 0  The thought of harming myself has occurred to me. 0  Edinburgh Postnatal Depression Scale Total 3      After visit meds:  Allergies as of 11/24/2022   No Known Allergies      Medication List     TAKE these medications    acetaminophen 325 MG tablet Commonly known as: Tylenol Take 2 tablets (650 mg total) by mouth every 6 (six) hours as needed (for pain scale < 4).   ibuprofen 600 MG tablet Commonly known as: ADVIL Take 1 tablet (600 mg total) by mouth every 6 (six) hours as needed.   multivitamin-prenatal 27-0.8 MG Tabs tablet Take 1 tablet by mouth daily at 12 noon.         Discharge home in stable condition Infant Feeding:  Breast Infant Disposition:home with mother Discharge instruction: per After Visit Summary and Postpartum booklet. Activity: Advance as tolerated. Pelvic rest for 6 weeks.  Diet: routine diet Anticipated Birth Control: PP Depo given Postpartum Appointment:2 weeks Additional Postpartum F/U: Postpartum Depression checkup Future Appointments: Future Appointments  Date Time Provider [place]  11/27/2022  1:55 PM Ulyses Panico, Nunzio Cobbs, CNM [facility] None   Follow up Visit:  Follow-up Information     Lurlean Horns, CNM. Schedule an appointment as soon as possible for a visit.   Specialty: Obstetrics Why: Video visit in 2 weeks Office visit in 6 weeks Contact information: [address] [place] [address] [phone]                     11/24/2022 Jillene Bucks [facility], CNM

## 2022-11-23 DIAGNOSIS — O42013 Preterm premature rupture of membranes, onset of labor within 24 hours of rupture, third trimester: Secondary | ICD-10-CM | POA: Diagnosis not present

## 2022-11-23 DIAGNOSIS — Z3A36 36 weeks gestation of pregnancy: Secondary | ICD-10-CM | POA: Diagnosis not present

## 2022-11-23 DIAGNOSIS — O0933 Supervision of pregnancy with insufficient antenatal care, third trimester: Secondary | ICD-10-CM | POA: Diagnosis not present

## 2022-11-23 LAB — CBC
HCT: 27.3 % — ABNORMAL LOW (ref 36.0–46.0)
Hemoglobin: 8.9 g/dL — ABNORMAL LOW (ref 12.0–15.0)
MCH: 26.6 pg (ref 26.0–34.0)
MCHC: 32.6 g/dL (ref 30.0–36.0)
MCV: 81.7 fL (ref 80.0–100.0)
Platelets: 211 10*3/uL (ref 150–400)
RBC: 3.34 MIL/uL — ABNORMAL LOW (ref 3.87–5.11)
RDW: 15.7 % — ABNORMAL HIGH (ref 11.5–15.5)
WBC: 13.1 10*3/uL — ABNORMAL HIGH (ref 4.0–10.5)
nRBC: 0 % (ref 0.0–0.2)

## 2022-11-23 LAB — CULTURE, OB URINE

## 2022-11-23 NOTE — Progress Notes (Signed)
[facility] Subjective:  Doing well postpartum Day 1; she is tolerating regular diet, her pain is controlled with PO medications, she is ambulating and voiding without difficulty. She reports baby has not latched well yet and she is looking forward to visit from [facility].  Objective:  Vital signs in last 24 hours: Temp:  [98.1 F (36.7 C)-98.7 F (37.1 C)] 98.7 F (37.1 C) (01/26 0830) Pulse Rate:  [67-113] 78 (01/26 0830) Resp:  [18] 18 (01/26 0830) BP: (63-134)/(18-88) 116/76 (01/26 0830) SpO2:  [93 %-100 %] 99 % (01/26 0830)    General: NAD Pulmonary: no increased work of breathing Abdomen: non-distended, non-tender, fundus firm at level of umbilicus Extremities: no edema, no erythema, no tenderness  Results for orders placed or performed during the hospital encounter of 11/21/22 (from the past 72 hour(s))  CBC     Status: Abnormal   Collection Time: 11/21/22  3:11 PM  Result Value Ref Range   WBC 13.1 (H) 4.0 - 10.5 K/uL   RBC 4.19 3.87 - 5.11 MIL/uL   Hemoglobin 11.4 (L) 12.0 - 15.0 g/dL   HCT 34.4 (L) 36.0 - 46.0 %   MCV 82.1 80.0 - 100.0 fL   MCH 27.2 26.0 - 34.0 pg   MCHC 33.1 30.0 - 36.0 g/dL   RDW 15.5 11.5 - 15.5 %   Platelets 271 150 - 400 K/uL   nRBC 0.0 0.0 - 0.2 %    Comment: Performed at [facility], [address] [place], [place] [address]  Comprehensive metabolic panel     Status: Abnormal   Collection Time: 11/21/22  3:11 PM  Result Value Ref Range   Sodium 134 (L) 135 - 145 mmol/L   Potassium 3.7 3.5 - 5.1 mmol/L   Chloride 105 98 - 111 mmol/L   CO2 22 22 - 32 mmol/L   Glucose, Bld 69 (L) 70 - 99 mg/dL    Comment: Glucose reference range applies only to samples taken after fasting for at least 8 hours.   BUN 13 6 - 20 mg/dL   Creatinine, Ser 0.52 0.44 - 1.00 mg/dL   Calcium 8.9 8.9 - 10.3 mg/dL   Total Protein 6.6 6.5 - 8.1 g/dL   Albumin 3.1 (L) 3.5 - 5.0 g/dL   AST 15 15 - 41 U/L   ALT 9 0 - 44 U/L   Alkaline Phosphatase 115 38 -  126 U/L   Total Bilirubin 0.4 0.3 - 1.2 mg/dL   GFR, Estimated >60 >60 mL/min    Comment: (NOTE) Calculated using the CKD-EPI Creatinine Equation (2021)    Anion gap 7 5 - 15    Comment: Performed at [facility], [address] [place], [place] [address]  Protein / creatinine ratio, urine     Status: Abnormal   Collection Time: 11/21/22  3:15 PM  Result Value Ref Range   Creatinine, Urine 156 mg/dL   Total Protein, Urine 35 mg/dL    Comment: NO NORMAL RANGE ESTABLISHED FOR THIS TEST   Protein Creatinine Ratio 0.22 (H) 0.00 - 0.15 mg/mg[Cre]    Comment: Performed at [facility], [address] [place], [place] [address]  [facility] only)     Status: None   Collection Time: 11/21/22  3:15 PM  Result Value Ref Range   [facility] POSITIVE     Comment: Performed at [facility], [address] [place],  [address]  Type and screen [place]  Status: None   Collection Time: 11/21/22  4:17 PM  Result Value Ref Range   ABO/RH(D) O POS    Antibody Screen NEG    Sample Expiration      11/24/2022,2359 Performed at [facility], [address] [place], [place] [address]   Group B strep by PCR     Status: None   Collection Time: 11/21/22  4:26 PM   Specimen: Vaginal/Rectal; Genital  Result Value Ref Range   Group B strep by PCR NEGATIVE NEGATIVE    Comment: (NOTE) Intrapartum testing with Xpert GBS assay should be used as an adjunct to other methods available and not used to replace antepartum testing (at 35-[redacted] weeks gestation). Performed at [facility], [address] [place], [place] [address]   ABO/Rh     Status: None   Collection Time: 11/21/22  4:26 PM  Result Value Ref Range   ABO/RH(D)      O POS Performed at [facility], [address] [place], [place] [address]   CBC     Status: Abnormal   Collection Time: 11/23/22  6:16 AM  Result Value Ref Range   WBC 13.1 (H) 4.0  - 10.5 K/uL   RBC 3.34 (L) 3.87 - 5.11 MIL/uL   Hemoglobin 8.9 (L) 12.0 - 15.0 g/dL   HCT 27.3 (L) 36.0 - 46.0 %   MCV 81.7 80.0 - 100.0 fL   MCH 26.6 26.0 - 34.0 pg   MCHC 32.6 30.0 - 36.0 g/dL   RDW 15.7 (H) 11.5 - 15.5 %   Platelets 211 150 - 400 K/uL   nRBC 0.0 0.0 - 0.2 %    Comment: Performed at [facility], [address] [place], [place] [address]    Assessment:   22 y.o. G1P0 postpartum day # 1, lactating  Plan:    1) Acute blood loss anemia - hemodynamically stable and asymptomatic - po ferrous sulfate  2) Blood Type --/--/O POS Performed at [facility], [address] [place], [place] [address]  [phone]/24 1626) / Rubella <0.90 (01/22 1605) / Varicella Immune  3) TDAP status  given antepartum  4) Feeding plan  breast  5)  Education given regarding options for contraception, as well as compatibility with breast feeding if applicable.  Patient is undecided on method of birth control- options reviewed.  6) Disposition: continue current care/discharge tomorrow   Christean Leaf, [facility] 11/23/2022, 10:31 AM

## 2022-11-23 NOTE — Anesthesia Postprocedure Evaluation (Signed)
Anesthesia Post Note  Patient: Karen [name]  Procedure(s) Performed: AN AD [place]  Patient location during evaluation: Mother Baby Anesthesia Type: Epidural Level of consciousness: awake and alert Pain management: pain level controlled Vital Signs Assessment: post-procedure vital signs reviewed and stable Respiratory status: spontaneous breathing, nonlabored ventilation and respiratory function stable Cardiovascular status: stable Postop Assessment: no headache, no backache, epidural receding and able to ambulate Anesthetic complications: no   No notable events documented.   Last Vitals:  Vitals:   11/23/22 0054 11/23/22 0314  BP: 116/68 110/69  Pulse: 74 77  Resp: 18 18  Temp: 36.8 C 37.1 C  SpO2: 99% 98%    Last Pain:  Vitals:   11/23/22 0400  TempSrc:   PainSc: 0-No pain                 Rolla Plate P

## 2022-11-23 NOTE — Discharge Instructions (Signed)
Discharge Instructions:   If there are any new medications, they have been ordered and will be available for pickup at the listed pharmacy on your way home from the hospital.   Call office if you have any of the following: headache, visual changes, fever >101.0 F, chills, shortness of breath, breast concerns, excessive vaginal bleeding, incision drainage or problems, leg pain or redness, depression or any other concerns. If you have vaginal discharge with an odor, let your doctor know.   It is normal to bleed for up to 6 weeks. You should not soak through more than 1 pad in 1 hour. If you have a blood clot larger than your fist with continued bleeding, call your doctor.   Activity: Do not lift > 10 lbs for 6 weeks (do not lift anything heavier than your baby). No intercourse, tampons, swimming pools, hot tubs, baths (only showers) for 6 weeks.  No driving for 1-2 weeks. Continue prenatal vitamin, especially if breastfeeding. Increase calories and fluids (water) while breastfeeding.   Your milk will come in, in the next couple of days (right now it is colostrum). You may have a slight fever when your milk comes in, but it should go away on its own.  If it does not, and rises above 101 F please call the doctor. You will also feel achy and your breasts will be firm. They will also start to leak. If you are breastfeeding, continue as you have been and you can pump/express milk for comfort.   If you have too much milk, your breasts can become engorged, which could lead to mastitis. This is an infection of the milk ducts. It can be very painful and you will need to notify your doctor to obtain a prescription for antibiotics. You can also treat it with a shower or hot/cold compress.   For concerns about your baby, please call your pediatrician.  For breastfeeding concerns, the lactation consultant can be reached at [phone].   Postpartum blues (feelings of happy one minute and sad another minute)  are normal for the first few weeks but if it gets worse let your doctor know.   Congratulations! We enjoyed caring for you and your new bundle of joy!  

## 2022-11-23 NOTE — Lactation Note (Signed)
This note was copied from a baby's chart. Lactation Consultation Note  Patient Name: Karen [name] GYBWL'S Date: 11/23/2022 Reason for consult: Initial assessment;Primapara;Late-preterm 34-36.6wks;Breastfeeding assistance;Other (Comment) (inconsistent breastfeeding) Age:20 hours  Maternal Data This is mom's first baby, SVD. Mom with history of late, limited prenatal care.   Initial visit discussed with mom as baby is late preterm(36 5/7 weeks) baby may not be consistent at breastfeeding.Per mom's report she was able to latch the baby at the last feeding and at other feeding mom has bottle fed the baby. Has patient been taught Hand Expression?: Yes Does the patient have breastfeeding experience prior to this delivery?: No  Feeding Mother's Current Feeding Choice: Breast Milk and Formula Nipple Type: Slow - flow Baby briefly awakened. Breastfeeding attempted.Assisted mom with maximizing position and latch techniques. Baby latched and detached and would not arouse to attempt again. Per mom baby ate 2 hours ago. Mom will attempt again in 1 hour and also offer supplemental feed by bottle.Mom will post pump after breastfeeding /breastfeeding attempts.  Lactation Tools Discussed/Used Tools: Pump Breast pump type: Double-Electric Breast Pump Pump Education: Setup, frequency, and cleaning;Milk Storage Reason for Pumping: LPT infant with inconsistent breastfeeding Pumping frequency: after each breastfeeding attempt every 3 hours with a goal of 8 times in 24 hours. Pumped volume:  (1 drop from left breast)  Interventions Interventions: Breast feeding basics reviewed;Hand express;Breast massage;Assisted with latch;Breast compression;Adjust position;Support pillows;Position options;Hand pump;DEBP;Education;LPT handout/interventions .Reviewed and provided mom late preterm feeding plan. Mom verbalized understanding and is in agreement with plan. Late preterm feeding plan:  - offer baby to eat at  least every 3 hours or sooner if baby shows feeding cues with a goal of at least 8 feeds/24 hours.    - if mom offers baby to breastfeed at a feeding session mom will observe baby is actively feeding, looking and listening for swallows.   - if baby falls asleep, doesn't latch, or mom does not hear/see swallows mom will stop the breastfeeding attempt and dad/grandma will offer baby any pumped breastmilk and/or the formula supplement.   -for now mom will time limit breastfeeding attempt to 10 minutes unless baby is actively feeding.   - if mom offers baby to breastfeed at a feeding session offer baby a supplemental feeding after breastfeeding/breastfeeding attempt as well as for those feedings mom does not breastfeed. (Goal of at least 8 feeding in 24 hours, baby may take 8-12 feedings in 24 hours). Overtime as the baby becomes more and more proficient at breastfeeding she will take less and less of the supplemental feeding after breastfeeding.   -increase volumes of supplementation(mom's breastmilk and/or the formula supplement) and follow baby's signs for satiety. (0-24 hours of life offer 5-10 ml's, 24-48 hours of life offer 10-20 ml's, 48-72 hours offer 20-30 ml's and after 72 hours offer a minimum of 30 ml's- know the baby may take more).   -until baby is consistently breastfeeding mom will pump after breastfeeding/feeding attempts (Goal is 8 pump sessions in 24 hours).   -It is recommended to keep feeding diary and wet/stool diapers and take to first pediatric check-up.   -Once home if baby refuses 2 feedings in a row (6 hours) call the baby's pediatrician.  Discharge Pump: Manual (Discussed with mom options to obtain breastpump:rent, WIC if she can qualifyfor the program) Mom reports she may buy a pump.  Consult Status Consult Status: Follow-up Date: 11/24/22 Follow-up type: In-patient  Update provided to care nurse.  Jonna Roneshia Drew 11/23/2022,  4:37 PM

## 2022-11-23 NOTE — Clinical Social Work Maternal (Signed)
  CLINICAL SOCIAL WORK MATERNAL/CHILD NOTE  Patient Details  Name: Karen [name] MRN: [id number] Date of Birth: 2000-12-03  Date:  11/23/2022  Clinical Social Worker Initiating Note:  Doran Clay RN BSN Case Manager Date/Time: Initiated:  11/23/22/1628     Child's Name:  Karen [name]   Biological Parents:  Mother, Father   Need for Interpreter:  None   Reason for Referral:  Late or No Prenatal Care     Address:  [place] [address]    Phone number:  [phone] (home)     Additional phone number: NA  Household Members/Support Persons (HM/SP):   Household Member/Support Person 1, Household Member/Support Person 2, Household Member/Support Person 3, Household Member/Support Person 4   HM/SP Name Relationship DOB or Age  HM/SP -[place] father 34  HM/SP -[place] mother 80  HM/SP -[place] sister 17  HM/SP -[place] sister 34  HM/SP -5        HM/SP -6        HM/SP -7        HM/SP -8          Natural Supports (not living in the home):  Spouse/significant other   Professional Supports: None   Employment: Part-time   Type of Work: Designer, fashion/clothing:  Attending college   Homebound arranged:    Museum/gallery curator Resources:  Medicaid   Other Resources:      Cultural/Religious Considerations Which May Impact Care:  NA  Strengths:  Ability to meet basic needs  , Compliance with medical plan  , Home prepared for child  , Pediatrician chosen   Psychotropic Medications:         Pediatrician:     ([facility])  Pediatrician List:   [place]      Pediatrician Fax Number:    Risk Factors/Current Problems:  None   Cognitive State:  Able to Concentrate  , Alert     Mood/Affect:  Bright  , Calm  , Happy     CSW Assessment: TOC consult for late prenatal care and teen pregnancy.  Patient is 22  years old and a sophomore in college, she has great family support, mother and sisters at the bedside. Patient lives with her parents and her sisters.  Father of the baby is Karen [name] 74 years old, they are together.  Patient reports that she did have prenatal care that started in August but she was in another [place] visiting family.  She did not realize how far along she was. She has chosen a pediatrician, the home is prepared for the newborn.  Will provide postpartum Depression information/education on the discharge instructions.    CSW Plan/Description:  No Further Intervention Required/No Barriers to Discharge, Perinatal Mood and Anxiety Disorder (PMADs) Education    Shelbie Hutching, RN 11/23/2022, 4:30 PM

## 2022-11-24 DIAGNOSIS — Z3A36 36 weeks gestation of pregnancy: Secondary | ICD-10-CM | POA: Diagnosis not present

## 2022-11-24 DIAGNOSIS — O42013 Preterm premature rupture of membranes, onset of labor within 24 hours of rupture, third trimester: Secondary | ICD-10-CM | POA: Diagnosis not present

## 2022-11-24 DIAGNOSIS — O0933 Supervision of pregnancy with insufficient antenatal care, third trimester: Secondary | ICD-10-CM | POA: Diagnosis not present

## 2022-11-24 LAB — CULTURE, BETA STREP (GROUP B ONLY): Strep Gp B Culture: NEGATIVE

## 2022-11-24 MED ORDER — ACETAMINOPHEN 325 MG PO TABS: 650.0000 mg | ORAL_TABLET | Freq: Four times a day (QID) | ORAL | Status: DC | PRN

## 2022-11-24 MED ORDER — IBUPROFEN 600 MG PO TABS: 600.0000 mg | ORAL_TABLET | Freq: Four times a day (QID) | ORAL | 0 refills | Status: DC | PRN

## 2022-11-24 MED ORDER — MEDROXYPROGESTERONE ACETATE 150 MG/ML IM SUSP
150.0000 mg | Freq: Once | INTRAMUSCULAR | Status: AC
  Administered 2022-11-24: 150 mg via INTRAMUSCULAR
  Filled 2022-11-24: qty 1

## 2022-11-24 NOTE — Lactation Note (Addendum)
This note was copied from a baby's chart. Lactation Consultation Note  Patient Name: Karen [name] YYQMG'N Date: 11/24/2022 Reason for consult: Follow-up assessment;Primapara;Late-preterm 34-36.6wks;Infant < 6lbs Age:20 hours  Maternal Data Has patient been taught Hand Expression?: Yes Does the patient have breastfeeding experience prior to this delivery?: No  Feeding Mother's Current Feeding Choice: Breast Milk and Formula Mom nursing baby in cradle hold on right, shown how to support breast while baby nursing, swallows noted, to offer left breast after burping baby and then supplement after if feeding not successful LATCH Score Latch: Grasps breast easily, tongue down, lips flanged, rhythmical sucking.  Audible Swallowing: A few with stimulation  Type of Nipple: Everted at rest and after stimulation  Comfort (Breast/Nipple): Filling, red/small blisters or bruises, mild/mod discomfort  Hold (Positioning): No assistance needed to correctly position infant at breast.  LATCH Score: 8   Lactation Tools Discussed/Used  [facility] name and no written on white board  Interventions Interventions: Hand express;Adjust position;Education (has purelan for nipple tenderness), coconut oil also given with instruction in use  Discharge Pump: Manual;DEBP (plans on purchasing a breast pump) WIC Program: No (plans on applying)  Consult Status Consult Status: PRN Date: 11/24/22 Follow-up type: In-patient    Ferol Luz 11/24/2022, 11:03 AM

## 2022-11-24 NOTE — Progress Notes (Signed)
Patient discharged home with infant. Discharge instructions and prescriptions given and reviewed with patient. Patient verbalized understanding. Escorted out by staff.  

## 2022-11-25 LAB — MATERNIT 21 PLUS CORE, BLOOD
Fetal Fraction: 14
Result (T21): NEGATIVE
Trisomy 13 (Patau syndrome): NEGATIVE
Trisomy 18 (Edwards syndrome): NEGATIVE
Trisomy 21 (Down syndrome): NEGATIVE

## 2022-11-26 ENCOUNTER — Telehealth: Payer: Self-pay | Admitting: Licensed Clinical Social Worker

## 2022-11-26 NOTE — Patient Outreach (Signed)
Transition Care Management Follow-up Telephone Call Date of discharge and from where: 11/24/22 from [facility] How have you been since you were released from the hospital? I'm doing better Any questions or concerns? Yes  Items Reviewed: Did the pt receive and understand the discharge instructions provided? Yes  Medications obtained and verified? Yes  Other? No  Any new allergies since your discharge? No  Dietary orders reviewed? No, routine diet suggested Do you have support at home? Yes   Home Care and Equipment/Supplies: Were home health services ordered? no If so, what is the name of the agency? na  Has the agency set up a time to come to the patient's home? not applicable Were any new equipment or medical supplies ordered?  No What is the name of the medical supply agency? Na Were you able to get the supplies/equipment? not applicable Do you have any questions related to the use of the equipment or supplies? na  Functional Questionnaire: (I = Independent and D = Dependent) ADLs: I  Bathing/Dressing- I  Meal Prep- I  Eating- I  Maintaining continence- I  Transferring/Ambulation- I  Managing Meds- I  Follow up appointments reviewed:  PCP Hospital f/u appt confirmed? No  Encouraged patient to schedule this but does have OB follow up tomorrow.  [facility] f/u appt confirmed? Yes  Scheduled to see OBGYN tomorrow for follow up Are transportation arrangements needed? No  If their condition worsens, is the pt aware to call PCP or go to the Emergency Dept.? Yes Was the patient provided with contact information for the PCP's office or ED? Yes Was to pt encouraged to call back with questions or concerns? Yes  Karen [name], BSW, MSW, [employer] Managed Medicaid [email or web address] Phone: [phone]

## 2022-11-27 ENCOUNTER — Encounter: Payer: Medicaid Other | Admitting: Licensed Practical Nurse

## 2022-11-27 ENCOUNTER — Telehealth: Payer: Self-pay | Admitting: Licensed Practical Nurse

## 2022-11-27 NOTE — Telephone Encounter (Signed)
Reached out to pt to reschedule ROB appt that was scheduled on 11/27/2022 with LMD at 1:55.  Left message for pt to call back.

## 2022-11-28 NOTE — Telephone Encounter (Signed)
Reached out to pt (2x) to reschedule ROB appt that was scheduled on 11/27/2022 with LMD at 1:55.  Was able to reschedule for 11/30/22 with Deneise Lever at 2:35.

## 2022-11-29 LAB — URINALYSIS, ROUTINE W REFLEX MICROSCOPIC

## 2022-11-29 LAB — CERVICOVAGINAL ANCILLARY ONLY
Comment: NEGATIVE
Comment: NORMAL

## 2022-11-30 ENCOUNTER — Telehealth: Payer: Self-pay | Admitting: Certified Nurse Midwife

## 2022-11-30 ENCOUNTER — Encounter: Payer: Medicaid Other | Admitting: Certified Nurse Midwife

## 2022-11-30 NOTE — Telephone Encounter (Signed)
Reached out to pt to reschedule ROB appt that was scheduled with Deneise Lever on 11/30/22 at 2:35.  Left message for pt to call back.

## 2022-12-04 ENCOUNTER — Encounter: Payer: Self-pay | Admitting: Certified Nurse Midwife

## 2022-12-04 NOTE — Telephone Encounter (Signed)
Reached out to pt (2x) to reschedule ROB appt that was scheduled with Deneise Lever on 11/30/22 at 2:35.  Left message for pt to call back.  Will send a MyChart letter.

## 2022-12-27 ENCOUNTER — Encounter: Payer: Self-pay | Admitting: Radiology

## 2023-01-23 ENCOUNTER — Telehealth: Payer: Self-pay

## 2023-01-23 NOTE — Telephone Encounter (Signed)
[facility]- Discharge Call Backs-Left pt a VM about the following below. 1-Do you have any questions or concerns about yourself as you heal? 2-Any concerns or questions about your baby? 3-How was your stay at the hospital? 4-How did our team work together to care for you? You should be receiving a survey in the mail soon.   We would really appreciate it if you could fill that out for us and return it in the mail.  We value the feedback to make improvements and continue the great work we do.   If you have any questions please feel free to call me back at [phone]  

## 2023-03-19 ENCOUNTER — Ambulatory Visit (INDEPENDENT_AMBULATORY_CARE_PROVIDER_SITE_OTHER): Payer: Medicaid Other | Admitting: Physician Assistant

## 2023-03-19 ENCOUNTER — Encounter: Payer: Self-pay | Admitting: Physician Assistant

## 2023-03-19 VITALS — BP 119/72 | HR 86 | Ht 64.0 in | Wt 219.0 lb

## 2023-03-19 DIAGNOSIS — M79641 Pain in right hand: Secondary | ICD-10-CM

## 2023-03-19 DIAGNOSIS — M79642 Pain in left hand: Secondary | ICD-10-CM | POA: Diagnosis not present

## 2023-03-19 DIAGNOSIS — Z7689 Persons encountering health services in other specified circumstances: Secondary | ICD-10-CM | POA: Diagnosis not present

## 2023-03-19 DIAGNOSIS — E669 Obesity, unspecified: Secondary | ICD-10-CM | POA: Diagnosis not present

## 2023-03-19 NOTE — Progress Notes (Unsigned)
Established patient visit  Patient: Karen [name]   DOB: [birth year]   22 y.o. Female  MRN: [id number] Visit Date: 03/19/2023  Today's healthcare provider: Debera Lat, PA-C   Chief Complaint  Patient presents with   New Patient (Initial Visit)   Subjective    HPI HPI   Finger 1,2,3rd bilateral--numbness and tingling worse at night --2 weeks. Last edited by Shelly Bombard, CMA on 03/19/2023  9:15 AM.      Carpal Tunnel Syndrome: Patient presents for presents evaluation of pain in hands and hand paresthesias.  Onset of the symptoms was  a week and a half of a week ago . Current symptoms include tingling involving the {Desc; anatomic:15099} aspect of the {Desc; right/left greater than:14298} hand, of 8 out of 10 severity, pain involving the {Desc; anatomic:15099} aspect of the {Desc; right/left greater than:14298} hand, of 9 out of 10 severity, and weakness involving the {Desc; right/left greater than:14298} hand, of moderate, fairly severe severity. Inciting event/aggravating factors: none known Patient's course of sx:{Symptom progression:14348}. Evaluation to date: none.  Treatment to date:  contrast bath, massages, exercise .      03/19/2023    9:12 AM 06/24/2020   11:37 PM  Depression screen PHQ 2/9  Decreased Interest 0 0  Down, Depressed, Hopeless 0 0  PHQ - 2 Score 0 0  Altered sleeping 0 0  Tired, decreased energy 0 0  Change in appetite 0 0  Feeling bad or failure about yourself  0 0  Trouble concentrating 0 0  Moving slowly or fidgety/restless 0 0  Suicidal thoughts 0 0  PHQ-9 Score 0 0  Difficult doing work/chores Not difficult at all        No data to display          Medications: Outpatient Medications Prior to Visit  Medication Sig   [DISCONTINUED] acetaminophen (TYLENOL) 325 MG tablet Take 2 tablets (650 mg total) by mouth every 6 (six) hours as needed (for pain scale < 4).   [DISCONTINUED] ibuprofen (ADVIL) 600 MG tablet Take 1 tablet (600 mg total) by  mouth every 6 (six) hours as needed. (Patient not taking: Reported on 03/19/2023)   [DISCONTINUED] Prenatal Vit-Fe Fumarate-FA (MULTIVITAMIN-PRENATAL) 27-0.8 MG TABS tablet Take 1 tablet by mouth daily at 12 noon.   No facility-administered medications prior to visit.    Review of Systems  Endocrine: Negative for cold intolerance, heat intolerance, polydipsia, polyphagia and polyuria.  All other systems reviewed and are negative.  Except see HPI   {Labs  Heme  Chem  Endocrine  Serology  Results Review (optional):23779}   Objective    BP 119/72 (BP Location: Right Arm, Patient Position: Sitting, Cuff Size: Small)   Pulse 86   Ht 5\' 4"  (1.626 m)   Wt 219 lb (99.3 kg)   SpO2 97%   Breastfeeding Yes   BMI 37.59 kg/m  {Show previous vital signs (optional):23777}  Physical Exam   No results found for any visits on 03/19/23.  Assessment & Plan    ***  No follow-ups on file.     The patient was advised to call back or seek an in-person evaluation if the symptoms worsen or if the condition fails to improve as anticipated.  I discussed the assessment and treatment plan with the patient. The patient was provided an opportunity to ask questions and all were answered. The patient agreed with the plan and demonstrated an understanding of the instructions.  I, Debera Lat, PA-C have reviewed  all documentation for this visit. The documentation on  03/19/23 for the exam, diagnosis, procedures, and orders are all accurate and complete.  Debera Lat, [facility], MMS [facility] [phone] (phone) [phone] (fax)  [facility]

## 2023-03-20 DIAGNOSIS — E669 Obesity, unspecified: Secondary | ICD-10-CM | POA: Insufficient documentation

## 2023-03-20 DIAGNOSIS — Z7689 Persons encountering health services in other specified circumstances: Secondary | ICD-10-CM | POA: Insufficient documentation

## 2023-03-20 DIAGNOSIS — M79641 Pain in right hand: Secondary | ICD-10-CM | POA: Insufficient documentation

## 2023-03-20 DIAGNOSIS — M79642 Pain in left hand: Secondary | ICD-10-CM | POA: Insufficient documentation

## 2023-03-20 LAB — COMPREHENSIVE METABOLIC PANEL
ALT: 23 IU/L (ref 0–32)
AST: 19 IU/L (ref 0–40)
Albumin/Globulin Ratio: 1.6 (ref 1.2–2.2)
Albumin: 4.5 g/dL (ref 4.0–5.0)
Alkaline Phosphatase: 80 IU/L (ref 42–106)
BUN/Creatinine Ratio: 30 — ABNORMAL HIGH (ref 9–23)
BUN: 16 mg/dL (ref 6–20)
Bilirubin Total: 0.2 mg/dL (ref 0.0–1.2)
CO2: 20 mmol/L (ref 20–29)
Calcium: 9.7 mg/dL (ref 8.7–10.2)
Chloride: 105 mmol/L (ref 96–106)
Creatinine, Ser: 0.54 mg/dL — ABNORMAL LOW (ref 0.57–1.00)
Globulin, Total: 2.8 g/dL (ref 1.5–4.5)
Glucose: 118 mg/dL — ABNORMAL HIGH (ref 70–99)
Potassium: 4.8 mmol/L (ref 3.5–5.2)
Sodium: 139 mmol/L (ref 134–144)
Total Protein: 7.3 g/dL (ref 6.0–8.5)
eGFR: 135 mL/min/{1.73_m2} (ref >59)

## 2023-03-20 LAB — CBC WITH DIFFERENTIAL/PLATELET
Basophils Absolute: 0 10*3/uL (ref 0.0–0.2)
Basos: 0 %
EOS (ABSOLUTE): 0.1 10*3/uL (ref 0.0–0.4)
Eos: 2 %
Hematocrit: 40.6 % (ref 34.0–46.6)
Hemoglobin: 13 g/dL (ref 11.1–15.9)
Immature Grans (Abs): 0 10*3/uL (ref 0.0–0.1)
Immature Granulocytes: 0 %
Lymphocytes Absolute: 2 10*3/uL (ref 0.7–3.1)
Lymphs: 29 %
MCH: 26.7 pg (ref 26.6–33.0)
MCHC: 32 g/dL (ref 31.5–35.7)
MCV: 84 fL (ref 79–97)
Monocytes Absolute: 0.6 10*3/uL (ref 0.1–0.9)
Monocytes: 8 %
Neutrophils Absolute: 4.4 10*3/uL (ref 1.4–7.0)
Neutrophils: 61 %
Platelets: 345 10*3/uL (ref 150–450)
RBC: 4.86 x10E6/uL (ref 3.77–5.28)
RDW: 15.1 % (ref 11.7–15.4)
WBC: 7.1 10*3/uL (ref 3.4–10.8)

## 2023-03-20 LAB — LIPID PANEL
Chol/HDL Ratio: 4.6 ratio — ABNORMAL HIGH (ref 0.0–4.4)
Cholesterol, Total: 206 mg/dL — ABNORMAL HIGH (ref 100–199)
HDL: 45 mg/dL (ref >39)
LDL Chol Calc (NIH): 127 mg/dL — ABNORMAL HIGH (ref 0–99)
Triglycerides: 192 mg/dL — ABNORMAL HIGH (ref 0–149)
VLDL Cholesterol Cal: 34 mg/dL (ref 5–40)

## 2023-03-20 LAB — HEMOGLOBIN A1C
Est. average glucose Bld gHb Est-mCnc: 126 mg/dL
Hgb A1c MFr Bld: 6 % — ABNORMAL HIGH (ref 4.8–5.6)

## 2023-03-20 LAB — TSH: TSH: 3.07 u[IU]/mL (ref 0.450–4.500)

## 2023-03-20 LAB — SPECIMEN STATUS REPORT

## 2023-03-20 NOTE — Progress Notes (Signed)
Please, let pt know that her labs are WNL except elevated lipids, her A1C increased to 6.0. Advised to adhere to healthy low carb diet and daily exercise, drink plenty of water. We will discuss her labs in details during her next appt

## 2023-03-23 ENCOUNTER — Ambulatory Visit: Payer: Medicaid Other

## 2023-03-27 ENCOUNTER — Ambulatory Visit (INDEPENDENT_AMBULATORY_CARE_PROVIDER_SITE_OTHER): Payer: Medicaid Other | Admitting: Obstetrics

## 2023-03-27 ENCOUNTER — Encounter: Payer: Self-pay | Admitting: Obstetrics

## 2023-03-27 VITALS — BP 115/70 | HR 89 | Ht 64.0 in | Wt 222.4 lb

## 2023-03-27 DIAGNOSIS — Z3202 Encounter for pregnancy test, result negative: Secondary | ICD-10-CM

## 2023-03-27 DIAGNOSIS — Z30017 Encounter for initial prescription of implantable subdermal contraceptive: Secondary | ICD-10-CM

## 2023-03-27 DIAGNOSIS — Z3009 Encounter for other general counseling and advice on contraception: Secondary | ICD-10-CM

## 2023-03-27 LAB — POCT URINE PREGNANCY: Preg Test, Ur: NEGATIVE

## 2023-03-27 MED ORDER — ETONOGESTREL 68 MG ~~LOC~~ IMPL
68.0000 mg | DRUG_IMPLANT | Freq: Once | SUBCUTANEOUS | Status: AC
  Administered 2023-03-27: 68 mg via SUBCUTANEOUS

## 2023-03-27 NOTE — Progress Notes (Signed)
ENCOUNTER FOR NEXPLANON INSERTION  SUBJECTIVE Karen [name] is a 22 y.o. G1P0 who presents today for insertion of Nexplanon contraceptive device. She received a Depo shot in the hospital after the birth of her baby. She desires reversible long-term contraception. We have thoroughly reviewed the risks, benefits, and alternatives, and she has elected to proceed with Nexplanon insertion. This is her first visit since the birth of her baby. She reports that she is healing well with no concerns. She is breastfeeding and has resumed normal activities. She denies bowel or bladder issues. EPDS is 0.   OBJECTIVE BP 115/70   Pulse 89   Ht 5\' 4"  (1.626 m)   Wt 222 lb 6.4 oz (100.9 kg)   LMP 03/30/2022   Breastfeeding Yes   BMI 38.17 kg/m   UPT: negative  General: NAD, alert, cooperative  Procedure Note Left Arm Sterile Preparation:  Betadine Insertion site was selected 9 cm from the medial epicondyle and marked using a sterile marker. The procedure area was prepped in sterile fashion. Adequate anesthesia was achieved with 2 mL of 1% lidocaine injected subcutaneously. The Nexplanon applicator was inserted subcutaneously and the Nexplanon device was delivered. The applicator was removed from the insertion site. The capsule was palpated by myself and Kirby to confirm satisfactory placement. Blood loss was minimal. A pressure dressing was applied.  Chole tolerated the procedure well with no complications. Standard post-procedure care and return precautions were explained.   Guadlupe Spanish, CNM

## 2023-04-01 ENCOUNTER — Ambulatory Visit: Payer: Medicaid Other

## 2023-04-18 NOTE — Progress Notes (Deleted)
Vivien Rota Yamira Papa,acting as a Neurosurgeon for [employer], PA-C.,have documented all relevant documentation on the behalf of Debera Lat, PA-C,as directed by  [employer], PA-C while in the presence of [employer], PA-C.    Complete physical exam   Patient: Karen [name]   DOB: [birth year]   22 y.o. Female  MRN: [id number] Visit Date: 04/19/2023  Today's healthcare provider: Debera Lat, PA-C   No chief complaint on file.  Subjective    Karen [name] is a 22 y.o. female who presents today for a complete physical exam.  She reports consuming a {diet types:17450} diet. {Exercise:19826} She generally feels {well/fairly well/poorly:18703}. She reports sleeping {well/fairly well/poorly:18703}. She {does/does not:200015} have additional problems to discuss today.  HPI  ***  Past Medical History:  Diagnosis Date   AKI (acute kidney injury) (HCC) 11/23/2020   r/t dehydration - covid infection    COVID    Hepatic vein stenosis    Overweight(278.02) 03/03/2013   Past Surgical History:  Procedure Laterality Date   KNEE ARTHROSCOPY WITH ANTERIOR CRUCIATE LIGAMENT (ACL) REPAIR Left 04/13/2021   Procedure: KNEE ARTHROSCOPY WITH ANTERIOR CRUCIATE LIGAMENT (ACL) REPAIR WITH  LATERAL MENISECTOMY and DRILLING/MICROFRACTURE;  Surgeon: Teryl Lucy, MD;  Location: [facility];  Service: Orthopedics;  Laterality: Left;   KNEE ARTHROSCOPY WITH MENISCAL REPAIR Left 04/13/2021   Procedure: KNEE ARTHROSCOPY WITH MENISCAL Root Repair;  Surgeon: Teryl Lucy, MD;  Location: [facility];  Service: Orthopedics;  Laterality: Left;   WISDOM TOOTH EXTRACTION     Social History   Socioeconomic History   Marital status: Single    Spouse name: Not on file   Number of children: 0   Years of education: 12.5   Highest education level: Not on file  Occupational History   Occupation: college student  Tobacco Use   Smoking status: Never   Smokeless tobacco: Never   Vaping Use   Vaping Use: Never used  Substance and Sexual Activity   Alcohol use: Never   Drug use: Never   Sexual activity: Not Currently    Birth control/protection: Injection  Other Topics Concern   Not on file  Social History Narrative   Lives with both parents and siblings   Social Determinants of Health   Financial Resource Strain: Low Risk  (11/07/2022)   Overall Financial Resource Strain (CARDIA)    Difficulty of Paying Living Expenses: Not very hard  Food Insecurity: No Food Insecurity (11/21/2022)   Hunger Vital Sign    Worried About Running Out of Food in the Last Year: Never true    Ran Out of Food in the Last Year: Never true  Transportation Needs: No Transportation Needs (11/21/2022)   PRAPARE - Administrator, Civil Service (Medical): No    Lack of Transportation (Non-Medical): No  Physical Activity: Sufficiently Active (11/07/2022)   Exercise Vital Sign    Days of Exercise per Week: 5 days    Minutes of Exercise per Session: 60 min  Stress: No Stress Concern Present (11/07/2022)   [employer] of Occupational Health - Occupational Stress Questionnaire    Feeling of Stress : Not at all  Social Connections: Unknown (11/07/2022)   Social Connection and Isolation Panel [NHANES]    Frequency of Communication with Friends and Family: More than three times a week    Frequency of Social Gatherings with Friends and Family: Twice a week    Attends Religious Services: More than 4 times per year  Active Member of Clubs or Organizations: No    Attends Banker Meetings: Never    Marital Status: Not on file  Intimate Partner Violence: Not At Risk (11/21/2022)   Humiliation, Afraid, Rape, and Kick questionnaire    Fear of Current or Ex-Partner: No    Emotionally Abused: No    Physically Abused: No    Sexually Abused: No   Family Status  Relation Name Status   Mother  Alive   Father  Alive   Sister  Alive   Sister  Alive   MGM  Deceased    MGF  Deceased   PGM  Deceased   PGF  Deceased   Neg Hx  (Not Specified)   Family History  Problem Relation Age of Onset   Thyroid disease Mother    Diabetes Father    Hypertension Father    Healthy Sister    Healthy Sister    Healthy Maternal Grandmother    Healthy Maternal Grandfather    Healthy Paternal Grandmother    Healthy Paternal Grandfather    Cancer Neg Hx    Heart disease Neg Hx    No Known Allergies  Patient Care Team: Debera Lat, PA-C as PCP - General (Physician Assistant)   Medications: No outpatient medications prior to visit.   No facility-administered medications prior to visit.    Review of Systems  Constitutional: Negative.   HENT: Negative.    Eyes: Negative.   Respiratory: Negative.    Cardiovascular: Negative.   Gastrointestinal: Negative.   Endocrine: Negative.   Genitourinary: Negative.   Musculoskeletal: Negative.   Skin: Negative.   Allergic/Immunologic: Negative.   Neurological: Negative.   Hematological: Negative.   Psychiatric/Behavioral: Negative.      {Labs  Heme  Chem  Endocrine  Serology  Results Review (optional):23779}  Objective    LMP 03/30/2022  {Show previous vital signs (optional):23777}   Physical Exam  ***  Last depression screening scores    03/19/2023    9:12 AM 06/24/2020   11:37 PM  PHQ 2/9 Scores  PHQ - 2 Score 0 0  PHQ- 9 Score 0 0   Last fall risk screening    03/19/2023    9:12 AM  Fall Risk   Falls in the past year? 1  Number falls in past yr: 0   Last Audit-C alcohol use screening    03/19/2023    9:12 AM  Alcohol Use Disorder Test (AUDIT)  1. How often do you have a drink containing alcohol? 0  3. How often do you have six or more drinks on one occasion? 0   A score of 3 or more in women, and 4 or more in men indicates increased risk for alcohol abuse, EXCEPT if all of the points are from question 1   No results found for any visits on 04/19/23.  Assessment & Plan    Routine  Health Maintenance and Physical Exam  Exercise Activities and Dietary recommendations  Goals   None     Immunization History  Administered Date(s) Administered   DTaP 02/15/2003, 04/22/2003, 06/23/2003, 05/26/2004, 01/03/2007   H1N1 08/25/2008   HIB (PRP-OMP) 02/15/2003, 04/22/2003, 06/23/2003, 02/25/2004   HPV 9-valent 06/08/2015, 08/08/2015, 12/09/2015   Hepatitis A 03/03/2013   Hepatitis A, Ped/Adol-2 Dose 04/02/2014   Hepatitis B 06/23/2003, 05/26/2004, 01/03/2007   IPV 02/15/2003, 04/22/2003, 02/25/2004, 01/03/2007   Influenza Nasal 07/18/2007, 11/07/2011   Influenza Whole 10/06/2004, 08/17/2005, 07/23/2008, 08/12/2009, 10/13/2010   Influenza,inj,Quad PF,6+ Mos 12/09/2015,  10/15/2016, 12/18/2017, 08/12/2018, 11/11/2020   MMR 02/25/2004, 01/03/2007, 11/24/2022   Meningococcal B, OMV 06/24/2019, 06/24/2020   Meningococcal Conjugate 06/08/2015, 06/24/2019   PFIZER(Purple Top)SARS-COV-2 Vaccination 03/21/2020, 04/11/2020   Pneumococcal Conjugate-13 02/15/2003, 04/22/2003, 06/23/2003   Tdap 06/08/2015, 11/19/2022   Varicella 05/26/2004, 01/03/2007    Health Maintenance  Topic Date Due   COVID-19 Vaccine (3 - Pfizer risk series) 05/09/2020   INFLUENZA VACCINE  05/30/2023   CHLAMYDIA SCREENING  11/20/2023   DTaP/Tdap/Td (8 - Td or Tdap) 11/19/2032   HPV VACCINES  Completed   Hepatitis C Screening  Completed   HIV Screening  Completed    Discussed health benefits of physical activity, and encouraged her to engage in regular exercise appropriate for her age and condition.  ***  No follow-ups on file.     {provider attestation***:1}   Debera Lat, PA-C  [facility] [phone] (phone) [phone] (fax)  [facility]

## 2023-04-19 ENCOUNTER — Encounter: Payer: Medicaid Other | Admitting: Physician Assistant

## 2024-03-16 ENCOUNTER — Encounter: Payer: Self-pay | Admitting: Physician Assistant

## 2024-03-16 ENCOUNTER — Ambulatory Visit: Admitting: Physician Assistant

## 2024-03-16 VITALS — BP 117/76 | HR 95 | Temp 99.5°F | Ht 64.0 in | Wt 238.0 lb

## 2024-03-16 DIAGNOSIS — Z7689 Persons encountering health services in other specified circumstances: Secondary | ICD-10-CM

## 2024-03-16 DIAGNOSIS — Z124 Encounter for screening for malignant neoplasm of cervix: Secondary | ICD-10-CM

## 2024-03-16 DIAGNOSIS — Z6841 Body Mass Index (BMI) 40.0 and over, adult: Secondary | ICD-10-CM | POA: Diagnosis not present

## 2024-03-16 DIAGNOSIS — R7309 Other abnormal glucose: Secondary | ICD-10-CM

## 2024-03-16 DIAGNOSIS — Z113 Encounter for screening for infections with a predominantly sexual mode of transmission: Secondary | ICD-10-CM

## 2024-03-16 DIAGNOSIS — N3000 Acute cystitis without hematuria: Secondary | ICD-10-CM

## 2024-03-16 DIAGNOSIS — R300 Dysuria: Secondary | ICD-10-CM | POA: Diagnosis not present

## 2024-03-16 DIAGNOSIS — Z0001 Encounter for general adult medical examination with abnormal findings: Secondary | ICD-10-CM

## 2024-03-16 DIAGNOSIS — Z13220 Encounter for screening for lipoid disorders: Secondary | ICD-10-CM

## 2024-03-16 DIAGNOSIS — Z0000 Encounter for general adult medical examination without abnormal findings: Secondary | ICD-10-CM

## 2024-03-16 LAB — POCT URINALYSIS DIP (CLINITEK)
Bilirubin, UA: NEGATIVE
Blood, UA: NEGATIVE
Glucose, UA: NEGATIVE mg/dL
Ketones, POC UA: NEGATIVE mg/dL
Nitrite, UA: NEGATIVE
Spec Grav, UA: 1.02 (ref 1.010–1.025)
Urobilinogen, UA: 0.2 U/dL — NL
pH, UA: 6 (ref 5.0–8.0)

## 2024-03-16 MED ORDER — NITROFURANTOIN MONOHYD MACRO 100 MG PO CAPS: 100.0000 mg | ORAL_CAPSULE | Freq: Two times a day (BID) | ORAL | 0 refills | Status: AC

## 2024-03-16 NOTE — Progress Notes (Signed)
 Complete physical exam  Patient: Karen [name]   DOB: 2001/08/05   23 y.o. Female  MRN: [id number]  Subjective:     No chief complaint on file.   Karen [name] is a 23 y.o. female who presents today for a complete physical exam. She reports consuming a general diet. She plays tennis 3 times weekly. She generally feels well. She reports sleeping well. She does have additional problems to discuss today.   Patient complaints of dysuria and urinary frequency today. She denies abdominal pain or CVA tenderness. She denies fevers. She reports previous UTI while pregnant. She states symptoms feel similar.   Most recent fall risk assessment:    03/16/2024    1:35 PM  Fall Risk   Falls in the past year? 0     Most recent depression screenings:    03/16/2024    1:35 PM 03/19/2023    9:12 AM  PHQ 2/9 Scores  PHQ - 2 Score 0 0  PHQ- 9 Score 1 0    Vision:Not within last year , Dental: No current dental problems and No regular dental care , and STD: The patient reports a past history of: chlamydia and gonorrhea  Patient Care Team: Ostwalt, Janna, PA-C as PCP - General (Physician Assistant)   No outpatient medications prior to visit.   No facility-administered medications prior to visit.    Review of Systems  Constitutional:  Negative for chills, fever and malaise/fatigue.  Eyes:  Negative for blurred vision and double vision.  Respiratory:  Negative for cough and shortness of breath.   Cardiovascular:  Negative for chest pain and palpitations.  Genitourinary:  Positive for dysuria and frequency. Negative for hematuria.  Musculoskeletal:  Negative for joint pain and myalgias.  Neurological:  Negative for dizziness and headaches.  Psychiatric/Behavioral:  Negative for depression. The patient is not nervous/anxious.        Objective:     BP 117/76   Pulse 95   Temp 99.5 F (37.5 C)   Ht 5\' 4"  (1.626 m)   Wt 238 lb (108 kg)   SpO2 97%   BMI 40.85 kg/m    Physical Exam Constitutional:      Appearance: Normal appearance.  HENT:     Head: Normocephalic.     Mouth/Throat:     Mouth: Mucous membranes are moist.     Pharynx: Oropharynx is clear.  Eyes:     Extraocular Movements: Extraocular movements intact.     Conjunctiva/sclera: Conjunctivae normal.  Cardiovascular:     Rate and Rhythm: Normal rate and regular rhythm.     Heart sounds: Normal heart sounds. No murmur heard.    No gallop.  Pulmonary:     Effort: Pulmonary effort is normal.     Breath sounds: Normal breath sounds.  Abdominal:     General: Abdomen is flat.     Palpations: Abdomen is soft.     Tenderness: There is no abdominal tenderness. There is no right CVA tenderness or left CVA tenderness.  Genitourinary:    General: Normal vulva.     Labia:        Right: No lesion.        Left: No lesion.      Vagina: No vaginal discharge, erythema, tenderness, bleeding or lesions.     Cervix: No cervical motion tenderness, erythema or cervical bleeding.     Comments: Chaperone deferred  Musculoskeletal:     Right lower leg: No edema.  Left lower leg: No edema.  Skin:    General: Skin is warm and dry.  Neurological:     General: No focal deficit present.     Mental Status: She is alert and oriented to person, place, and time.  Psychiatric:        Mood and Affect: Mood normal.        Behavior: Behavior normal.      No results found for any visits on 03/16/24.    Assessment & Plan:    Routine Health Maintenance and Physical Exam  Health Maintenance  Topic Date Due   COVID-19 Vaccine (3 - 2024-25 season) 06/30/2023   Chlamydia screening  11/20/2023   Pap Smear  Never done   Flu Shot  05/29/2024   DTaP/Tdap/Td vaccine (8 - Td or Tdap) 11/19/2032   HPV Vaccine  Completed   Hepatitis C Screening  Completed   HIV Screening  Completed   Meningitis B Vaccine  Completed   Pneumococcal Vaccination  Aged Out    Discussed health benefits of physical activity,  and encouraged her to engage in regular exercise appropriate for her age and condition.  Problem List Items Addressed This Visit     Acute cystitis without hematuria   Patient appears stable today.  Physical exam without abnormal findings.  No CVA tenderness or suprapubic tenderness.  Urine dipstick done in office today. Urine sent for culture, will review sensitivities and treat as indicated.  Patient started on Macrobid  today.  Patient should return to office if she experiences nausea, vomiting, fevers, flank pain.        Relevant Medications   nitrofurantoin , macrocrystal-monohydrate, (MACROBID ) 100 MG capsule   Other Visit Diagnoses       Encounter to establish care    -  Primary     Annual visit for general adult medical examination without abnormal findings       Relevant Orders   CMP14+EGFR   CBC with Differential/Platelet     Screening for cervical cancer       Relevant Orders   IGP,CtNgTv,Apt HPV     Screen for STD (sexually transmitted disease)       Relevant Orders   IGP,CtNgTv,Apt HPV     Screening for lipid disorders       Relevant Orders   Lipid panel     BMI 40.0-44.9, adult (HCC)       Relevant Orders   CMP14+EGFR   TSH + free T4     Elevated hemoglobin A1c       Relevant Orders   Hemoglobin A1c   Ambulatory referral to diabetic education      Adult wellness-complete.wellness physical was conducted today. Importance of diet and exercise were discussed in detail.  Importance of stress reduction and healthy living were discussed.  In addition to this a discussion regarding safety was also covered.  We also reviewed over immunizations and gave recommendations regarding current immunization needed for age.   In addition to this additional areas were also touched on including: UTI symptoms as noted above  Preventative health exams needed: none, up to date as of today  Colonoscopy not indicated  Patient was advised yearly wellness exam   Return in about  1 year (around 03/16/2025).     Karen Mince Vincenzo Stave, PA-C

## 2024-03-16 NOTE — Assessment & Plan Note (Signed)
 Patient appears stable today.  Physical exam without abnormal findings.  No CVA tenderness or suprapubic tenderness.  Urine dipstick done in office today. Urine sent for culture, will review sensitivities and treat as indicated.  Patient started on Macrobid today.  Patient should return to office if she experiences nausea, vomiting, fevers, flank pain.

## 2024-03-16 NOTE — Addendum Note (Signed)
 Addended byHugo Maes on: 03/16/2024 02:24 PM   Modules accepted: Orders

## 2024-03-17 ENCOUNTER — Ambulatory Visit: Payer: Self-pay | Admitting: Physician Assistant

## 2024-03-17 LAB — CBC WITH DIFFERENTIAL/PLATELET
Basophils Absolute: 0 10*3/uL (ref 0.0–0.2)
Basos: 0 %
EOS (ABSOLUTE): 0.1 10*3/uL (ref 0.0–0.4)
Eos: 2 %
Hematocrit: 37.6 % (ref 34.0–46.6)
Hemoglobin: 12.5 g/dL (ref 11.1–15.9)
Immature Grans (Abs): 0.1 10*3/uL (ref 0.0–0.1)
Immature Granulocytes: 1 %
Lymphocytes Absolute: 2.6 10*3/uL (ref 0.7–3.1)
Lymphs: 29 %
MCH: 28 pg (ref 26.6–33.0)
MCHC: 33.2 g/dL (ref 31.5–35.7)
MCV: 84 fL (ref 79–97)
Monocytes Absolute: 0.7 10*3/uL (ref 0.1–0.9)
Monocytes: 8 %
Neutrophils Absolute: 5.3 10*3/uL (ref 1.4–7.0)
Neutrophils: 60 %
Platelets: 410 10*3/uL (ref 150–450)
RBC: 4.47 x10E6/uL (ref 3.77–5.28)
RDW: 14.7 % (ref 11.7–15.4)
WBC: 8.8 10*3/uL (ref 3.4–10.8)

## 2024-03-17 LAB — LIPID PANEL
Chol/HDL Ratio: 5 ratio — ABNORMAL HIGH (ref 0.0–4.4)
Cholesterol, Total: 203 mg/dL — ABNORMAL HIGH (ref 100–199)
HDL: 41 mg/dL (ref >39)
LDL Chol Calc (NIH): 131 mg/dL — ABNORMAL HIGH (ref 0–99)
Triglycerides: 173 mg/dL — ABNORMAL HIGH (ref 0–149)
VLDL Cholesterol Cal: 31 mg/dL (ref 5–40)

## 2024-03-17 LAB — CMP14+EGFR
ALT: 17 IU/L (ref 0–32)
AST: 15 IU/L (ref 0–40)
Albumin: 4.4 g/dL (ref 4.0–5.0)
Alkaline Phosphatase: 90 IU/L (ref 44–121)
BUN/Creatinine Ratio: 15 (ref 9–23)
BUN: 14 mg/dL (ref 6–20)
Bilirubin Total: 0.3 mg/dL (ref 0.0–1.2)
CO2: 19 mmol/L — ABNORMAL LOW (ref 20–29)
Calcium: 9.5 mg/dL (ref 8.7–10.2)
Chloride: 106 mmol/L (ref 96–106)
Creatinine, Ser: 0.91 mg/dL (ref 0.57–1.00)
Globulin, Total: 2.7 g/dL (ref 1.5–4.5)
Glucose: 88 mg/dL (ref 70–99)
Potassium: 4.5 mmol/L (ref 3.5–5.2)
Sodium: 141 mmol/L (ref 134–144)
Total Protein: 7.1 g/dL (ref 6.0–8.5)
eGFR: 92 mL/min/{1.73_m2} (ref >59)

## 2024-03-17 LAB — HEMOGLOBIN A1C
Est. average glucose Bld gHb Est-mCnc: 120 mg/dL
Hgb A1c MFr Bld: 5.8 % — ABNORMAL HIGH (ref 4.8–5.6)

## 2024-03-17 LAB — TSH+FREE T4
Free T4: 1.07 ng/dL (ref 0.82–1.77)
TSH: 2.99 u[IU]/mL (ref 0.450–4.500)

## 2024-03-19 LAB — URINE CULTURE

## 2024-03-19 LAB — IGP,CTNGTV,APT HPV
Chlamydia, Nuc. Acid Amp: NEGATIVE
Gonococcus, Nuc. Acid Amp: NEGATIVE
HPV Aptima: NEGATIVE
Trich vag by NAA: NEGATIVE

## 2024-03-19 LAB — SPECIMEN STATUS REPORT

## 2024-03-25 ENCOUNTER — Encounter: Payer: Self-pay | Admitting: Physician Assistant

## 2024-03-27 NOTE — Telephone Encounter (Signed)
 Cook, Jayce G, DO     03/27/24 10:03 AM Needs to be seen. I can't tell what is going on by this picture.

## 2024-03-31 ENCOUNTER — Encounter: Payer: Self-pay | Admitting: Physician Assistant

## 2024-03-31 ENCOUNTER — Ambulatory Visit: Admitting: Physician Assistant

## 2024-03-31 VITALS — BP 112/77 | HR 80 | Temp 98.0°F | Ht 64.0 in | Wt 237.0 lb

## 2024-03-31 DIAGNOSIS — L409 Psoriasis, unspecified: Secondary | ICD-10-CM

## 2024-03-31 MED ORDER — TRIAMCINOLONE ACETONIDE 0.5 % EX OINT: 1.0000 | TOPICAL_OINTMENT | Freq: Two times a day (BID) | CUTANEOUS | 0 refills | Status: AC

## 2024-03-31 NOTE — Progress Notes (Signed)
   Acute Office Visit  Subjective:     Patient ID: Karen [name], female    DOB: [birth year], 23 y.o.   MRN: [id number]   Patient presents today with concerns of rash on her left inner thigh. She states area has been present for approximately 2 weeks. She endorses pruritus and burning/irritation when she is in the shower. She denies discharge from area. She denies fevers. She has tried hydrocortisone cream at home with little improvement.      Review of Systems  Constitutional:  Negative for fever and malaise/fatigue.  Gastrointestinal:  Negative for nausea and vomiting.  Musculoskeletal:  Negative for myalgias and neck pain.  Skin:  Positive for itching and rash.        Objective:     BP 112/77   Pulse 80   Temp 98 F (36.7 C)   Ht 5\' 4"  (1.626 m)   Wt 237 lb (107.5 kg)   SpO2 97%   BMI 40.68 kg/m   Physical Exam Constitutional:      Appearance: Normal appearance. She is obese.  HENT:     Head: Normocephalic.     Nose: Nose normal.     Mouth/Throat:     Mouth: Mucous membranes are moist.     Pharynx: Oropharynx is clear.  Eyes:     Extraocular Movements: Extraocular movements intact.     Conjunctiva/sclera: Conjunctivae normal.  Cardiovascular:     Rate and Rhythm: Normal rate and regular rhythm.     Heart sounds: Normal heart sounds.  Pulmonary:     Effort: Pulmonary effort is normal.     Breath sounds: Normal breath sounds.  Skin:    General: Skin is warm and dry.     Findings: Rash present. Rash is papular and scaling.       Neurological:     Mental Status: She is alert.     No results found for any visits on 03/31/24.      Assessment & Plan:  Psoriasis Assessment & Plan: Patient presents today with 2 weeks of pruritic rash on left inner thigh. On physical exam, approximately 4-5cm area of hyperpigmentation with grey/silver scaling appearance. Symptoms and appearance most consistent with psoriasis. Triamcinolone BID for 2 weeks for symptoms.  Patient to follow up in 2 weeks if symptoms persist or do not improve. Will refer to dermatology if symptoms persist or do not improve.   Orders: -     Triamcinolone Acetonide; Apply 1 Application topically 2 (two) times daily.  Dispense: 30 g; Refill: 0     Return in about 2 weeks (around 04/14/2024) for if symptoms do not improve .  Jearlean Mince Donta Fuster, PA-C

## 2024-03-31 NOTE — Assessment & Plan Note (Signed)
 Patient presents today with 2 weeks of pruritic rash on left inner thigh. On physical exam, approximately 4-5cm area of hyperpigmentation with grey/silver scaling appearance. Symptoms and appearance most consistent with psoriasis. Triamcinolone BID for 2 weeks for symptoms. Patient to follow up in 2 weeks if symptoms persist or do not improve. Will refer to dermatology if symptoms persist or do not improve.

## 2024-04-27 ENCOUNTER — Telehealth: Payer: Self-pay | Admitting: Nutrition

## 2024-04-27 ENCOUNTER — Ambulatory Visit: Admitting: Nutrition

## 2024-04-27 NOTE — Telephone Encounter (Signed)
VM left to call and r/s missed appt. 

## 2024-06-27 ENCOUNTER — Ambulatory Visit: Payer: Self-pay

## 2024-07-06 ENCOUNTER — Encounter (HOSPITAL_COMMUNITY): Payer: Self-pay

## 2024-07-06 ENCOUNTER — Other Ambulatory Visit: Payer: Self-pay

## 2024-07-06 ENCOUNTER — Emergency Department (HOSPITAL_COMMUNITY)

## 2024-07-06 ENCOUNTER — Emergency Department (HOSPITAL_COMMUNITY)
Admission: EM | Admit: 2024-07-06 | Discharge: 2024-07-06 | Disposition: A | Discharge status: Home / Self Care | Attending: Emergency Medicine | Admitting: Emergency Medicine

## 2024-07-06 DIAGNOSIS — R109 Unspecified abdominal pain: Secondary | ICD-10-CM | POA: Insufficient documentation

## 2024-07-06 DIAGNOSIS — R350 Frequency of micturition: Secondary | ICD-10-CM | POA: Insufficient documentation

## 2024-07-06 DIAGNOSIS — R110 Nausea: Secondary | ICD-10-CM | POA: Insufficient documentation

## 2024-07-06 DIAGNOSIS — N201 Calculus of ureter: Secondary | ICD-10-CM

## 2024-07-06 LAB — CBC WITH DIFFERENTIAL/PLATELET
Abs Immature Granulocytes: 0.02 K/uL (ref 0.00–0.07)
Basophils Absolute: 0 K/uL (ref 0.0–0.1)
Basophils Relative: 0 %
Eosinophils Absolute: 0.1 K/uL (ref 0.0–0.5)
Eosinophils Relative: 1 %
HCT: 39.3 % (ref 36.0–46.0)
Hemoglobin: 12.8 g/dL (ref 12.0–15.0)
Immature Granulocytes: 0 %
Lymphocytes Relative: 34 %
Lymphs Abs: 2.9 K/uL (ref 0.7–4.0)
MCH: 26.7 pg (ref 26.0–34.0)
MCHC: 32.6 g/dL (ref 30.0–36.0)
MCV: 81.9 fL (ref 80.0–100.0)
Monocytes Absolute: 0.7 K/uL (ref 0.1–1.0)
Monocytes Relative: 8 %
Neutro Abs: 4.9 K/uL (ref 1.7–7.7)
Neutrophils Relative %: 57 %
Platelets: 352 K/uL (ref 150–400)
RBC: 4.8 MIL/uL (ref 3.87–5.11)
RDW: 15.8 % — ABNORMAL HIGH (ref 11.5–15.5)
WBC: 8.5 K/uL (ref 4.0–10.5)
nRBC: 0 % (ref 0.0–0.2)

## 2024-07-06 LAB — COMPREHENSIVE METABOLIC PANEL WITH GFR
ALT: 20 U/L (ref 0–44)
AST: 19 U/L (ref 15–41)
Albumin: 3.9 g/dL (ref 3.5–5.0)
Alkaline Phosphatase: 68 U/L (ref 38–126)
Anion gap: 12 (ref 5–15)
BUN: 12 mg/dL (ref 6–20)
CO2: 20 mmol/L — ABNORMAL LOW (ref 22–32)
Calcium: 9 mg/dL (ref 8.9–10.3)
Chloride: 105 mmol/L (ref 98–111)
Creatinine, Ser: 0.77 mg/dL (ref 0.44–1.00)
GFR, Estimated: >60 mL/min (ref >60)
Glucose, Bld: 113 mg/dL — ABNORMAL HIGH (ref 70–99)
Potassium: 3.7 mmol/L (ref 3.5–5.1)
Sodium: 137 mmol/L (ref 135–145)
Total Bilirubin: 0.8 mg/dL (ref 0.0–1.2)
Total Protein: 7.5 g/dL (ref 6.5–8.1)

## 2024-07-06 LAB — URINALYSIS, ROUTINE W REFLEX MICROSCOPIC
Bilirubin Urine: NEGATIVE
Glucose, UA: NEGATIVE mg/dL
Ketones, ur: NEGATIVE mg/dL
Nitrite: NEGATIVE
Protein, ur: 30 mg/dL — AB
RBC / HPF: >50 RBC/hpf (ref 0–5)
Specific Gravity, Urine: 1.025 (ref 1.005–1.030)
pH: 5 (ref 5.0–8.0)

## 2024-07-06 LAB — POC URINE PREG, ED: Preg Test, Ur: NEGATIVE

## 2024-07-06 LAB — LIPASE, BLOOD: Lipase: 31 U/L (ref 11–51)

## 2024-07-06 MED ORDER — OXYCODONE-ACETAMINOPHEN 5-325 MG PO TABS: 1.0000 | ORAL_TABLET | Freq: Four times a day (QID) | ORAL | 0 refills | Status: AC | PRN

## 2024-07-06 MED ORDER — SODIUM CHLORIDE 0.9 % IV BOLUS
1000.0000 mL | Freq: Once | INTRAVENOUS | Status: AC
  Administered 2024-07-06: 1000 mL via INTRAVENOUS

## 2024-07-06 MED ORDER — KETOROLAC TROMETHAMINE 15 MG/ML IJ SOLN
15.0000 mg | Freq: Once | INTRAMUSCULAR | Status: AC
  Administered 2024-07-06: 15 mg via INTRAVENOUS
  Filled 2024-07-06: qty 1

## 2024-07-06 MED ORDER — ONDANSETRON 4 MG PO TBDP: 4.0000 mg | ORAL_TABLET | Freq: Three times a day (TID) | ORAL | 0 refills | Status: AC | PRN

## 2024-07-06 MED ORDER — CEPHALEXIN 500 MG PO CAPS
500.0000 mg | ORAL_CAPSULE | Freq: Once | ORAL | Status: AC
  Administered 2024-07-06: 500 mg via ORAL
  Filled 2024-07-06: qty 1

## 2024-07-06 MED ORDER — CEPHALEXIN 500 MG PO CAPS: 500.0000 mg | ORAL_CAPSULE | Freq: Three times a day (TID) | ORAL | 0 refills | Status: AC

## 2024-07-06 MED ORDER — ONDANSETRON HCL 4 MG/2ML IJ SOLN
4.0000 mg | Freq: Once | INTRAMUSCULAR | Status: AC
  Administered 2024-07-06: 4 mg via INTRAVENOUS
  Filled 2024-07-06: qty 2

## 2024-07-06 MED ORDER — MORPHINE SULFATE (PF) 4 MG/ML IV SOLN
4.0000 mg | Freq: Once | INTRAVENOUS | Status: AC
  Administered 2024-07-06: 4 mg via INTRAVENOUS
  Filled 2024-07-06: qty 1

## 2024-07-06 NOTE — Discharge Instructions (Signed)
 Please follow-up closely with urology on an outpatient basis.  Take all antibiotics as directed.  Return to emergency department immediately for any new or worsening symptoms.

## 2024-07-06 NOTE — ED Notes (Signed)
 Patient transported to CT

## 2024-07-06 NOTE — ED Provider Notes (Signed)
 [facility] AT [facility] Provider Note   CSN: [id number] Arrival date & time: 07/06/24  1137     Patient presents with: Flank Pain   Karen [name] is a 23 y.o. female.   Patient is a 23 year old female who presents to the emergency department with a chief complaint of associated right-sided flank pain, urinary frequency which began this morning.  Patient has had some associated nausea without vomiting.  She notes she has had no recent falls or blunt abdominal wall or back trauma.  She denies any associate abdominal pain.  She has had no associated chest pain, shortness of breath, fever, chills.   Flank Pain       Prior to Admission medications   Medication Sig Start Date End Date Taking? Authorizing Provider  triamcinolone  ointment (KENALOG ) 0.5 % Apply 1 Application topically 2 (two) times daily. 03/31/24   Grooms, [place], PA-C    Allergies: Patient has no known allergies.    Review of Systems  Genitourinary:  Positive for flank pain.  All other systems reviewed and are negative.   Updated Vital Signs BP 129/71 (BP Location: Right Arm)   Pulse 86   Temp 98.5 F (36.9 C) (Oral)   Resp 18   Ht 5' 4 (1.626 m)   Wt 107.5 kg   LMP 07/04/2024 (Exact Date)   SpO2 100%   BMI 40.68 kg/m   Physical Exam Vitals and nursing note reviewed.  Constitutional:      General: She is not in acute distress.    Appearance: Normal appearance. She is not ill-appearing.  HENT:     Head: Normocephalic and atraumatic.     Nose: Nose normal.     Mouth/Throat:     Mouth: Mucous membranes are moist.  Eyes:     Extraocular Movements: Extraocular movements intact.     Conjunctiva/sclera: Conjunctivae normal.     Pupils: Pupils are equal, round, and reactive to light.  Cardiovascular:     Rate and Rhythm: Normal rate and regular rhythm.     Pulses: Normal pulses.     Heart sounds: Normal heart sounds. No murmur heard.    No gallop.  Pulmonary:      Effort: Pulmonary effort is normal. No respiratory distress.     Breath sounds: Normal breath sounds. No stridor. No wheezing, rhonchi or rales.  Abdominal:     General: Abdomen is flat. Bowel sounds are normal. There is no distension.     Palpations: Abdomen is soft.     Tenderness: There is right CVA tenderness. There is no left CVA tenderness or guarding.     Comments: Tender to palpation along right flank  Musculoskeletal:        General: Normal range of motion.     Cervical back: Normal range of motion and neck supple.  Skin:    General: Skin is warm and dry.  Neurological:     General: No focal deficit present.     Mental Status: She is alert and oriented to person, place, and time. Mental status is at baseline.     Cranial Nerves: No cranial nerve deficit.     Sensory: No sensory deficit.     Motor: No weakness.     Coordination: Coordination normal.     Gait: Gait normal.  Psychiatric:        Mood and Affect: Mood normal.        Behavior: Behavior normal.  Thought Content: Thought content normal.        Judgment: Judgment normal.     (all labs ordered are listed, but only abnormal results are displayed) Labs Reviewed  URINALYSIS, ROUTINE W REFLEX MICROSCOPIC - Abnormal; Notable for the following components:      Result Value   APPearance CLOUDY (*)    Hgb urine dipstick LARGE (*)    Protein, ur 30 (*)    Leukocytes,Ua SMALL (*)    Bacteria, UA RARE (*)    All other components within normal limits  COMPREHENSIVE METABOLIC PANEL WITH GFR - Abnormal; Notable for the following components:   CO2 20 (*)    Glucose, Bld 113 (*)    All other components within normal limits  CBC WITH DIFFERENTIAL/PLATELET - Abnormal; Notable for the following components:   RDW 15.8 (*)    All other components within normal limits  POC URINE PREG, ED - Normal  URINE CULTURE  LIPASE, BLOOD    EKG: None  Radiology: No results found.   Procedures   Medications Ordered  in the ED  ketorolac  (TORADOL ) 15 MG/ML injection 15 mg (15 mg Intravenous Given 07/06/24 1255)  morphine  (PF) 4 MG/ML injection 4 mg (4 mg Intravenous Given 07/06/24 1259)  ondansetron  (ZOFRAN ) injection 4 mg (4 mg Intravenous Given 07/06/24 1257)  sodium chloride  0.9 % bolus 1,000 mL (0 mLs Intravenous Stopped 07/06/24 1351)                                    Medical Decision Making Amount and/or Complexity of Data Reviewed Labs: ordered. Radiology: ordered.  Risk Prescription drug management.   This patient presents to the ED for concern of flank pain  differential diagnosis includes acute appendicitis, cholecystitis, bowel obstruction, diverticulitis, ovarian torsion or cyst, PID, tubo-ovarian abscess, pyelonephritis, kidney stone, pancreatitis, mesenteric ischemia    Additional history obtained:  Additional history obtained from medical records External records from outside source obtained and reviewed including medical records   Lab Tests:  I Ordered, and personally interpreted labs.  The pertinent results include: No leukocytosis, no anemia, normal electrolytes, normal kidney function, normal liver function, urinalysis with large hemoglobin and small leukocytes   Imaging Studies ordered:  I ordered imaging studies including CT scan abdomen and pelvis I independently visualized and interpreted imaging which showed right sided distal ureteral stone, mild hydro I agree with the radiologist interpretation   Medicines ordered and prescription drug management:  I ordered medication including morphine , Zofran , Toradol , IV fluids, Keflex  for ureteral stone, urinary tract infection Reevaluation of the patient after these medicines showed that the patient improved I have reviewed the patients home medicines and have made adjustments as needed   Problem List / ED Course:  Patient is doing well at this time and is stable for discharge home.  Discussed with patient she does have a  right sided UVJ stone.  Patient has stable kidney function at this point and urinalysis is equivocal for UTI but will still cover with antibiotics and send urine for culture.  Vital signs are stable with no indication for sepsis.  CT scan abdomen pelvis demonstrated no signs of any other acute surgical process at this time.  Patient does have pain has greatly improved with treatment in the emergency department.  She is not currently breast-feeding at this point.  Will continue symptomatic treatment on outpatient basis and recommend close follow-up with urology.  Strict turn precautions were discussed for any new or worsening symptoms.  Patient voiced understanding and had no additional questions.   Social Determinants of Health:  None        Final diagnoses:  None    ED Discharge Orders     None          Daralene Lonni JONETTA DEVONNA 07/06/24 1512    Charlyn Sora, MD 07/07/24 [phone]

## 2024-07-06 NOTE — ED Triage Notes (Signed)
 Pt arrived via POV c/o sharp right flank pain that began this morning. Pt denies injury. Pt reports urinary frequency and denies hematuria.

## 2024-07-08 LAB — URINE CULTURE: Culture: NO GROWTH

## 2024-07-09 NOTE — Progress Notes (Deleted)
      GYNECOLOGY OFFICE PROCEDURE NOTE  Karen [name] is a 23 y.o. G1P0 here for Nexplanon  removal.  Last pap smear was on 03/16/2024 and was normal.  No other gynecologic concerns.  Nexplanon  Removal Patient identified, informed consent performed, consent signed.   Appropriate time out taken. Nexplanon  site identified.  Area prepped in usual sterile fashon. One ml of 1% lidocaine  was used to anesthetize the area at the distal end of the implant. A small stab incision was made right beside the implant on the distal portion.  The Nexplanon  rod was grasped using hemostats and removed without difficulty.  There was minimal blood loss. There were no complications.  3 ml of 1% lidocaine  was injected around the incision for post-procedure analgesia.  Steri-strips were applied over the small incision.  A pressure bandage was applied to reduce any bruising.  The patient tolerated the procedure well and was given post procedure instructions.  Patient is planning to use *** for contraception/attempt conception.   Damien Parsley, CNM [facility]

## 2024-07-13 ENCOUNTER — Ambulatory Visit: Admitting: Certified Nurse Midwife

## 2024-07-17 ENCOUNTER — Encounter: Payer: Self-pay | Admitting: *Deleted

## 2024-07-29 ENCOUNTER — Ambulatory Visit
Admission: EM | Admit: 2024-07-29 | Discharge: 2024-07-29 | Disposition: A | Discharge status: Home / Self Care | Attending: Nurse Practitioner | Admitting: Nurse Practitioner

## 2024-07-29 DIAGNOSIS — R519 Headache, unspecified: Secondary | ICD-10-CM | POA: Diagnosis not present

## 2024-07-29 DIAGNOSIS — H60502 Unspecified acute noninfective otitis externa, left ear: Secondary | ICD-10-CM

## 2024-07-29 LAB — POC SOFIA SARS ANTIGEN FIA: SARS Coronavirus 2 Ag: NEGATIVE

## 2024-07-29 MED ORDER — OFLOXACIN 0.3 % OT SOLN: 5.0000 [drp] | Freq: Two times a day (BID) | OTIC | 0 refills | Status: AC

## 2024-07-29 NOTE — ED Triage Notes (Signed)
 Pt reports left ear pain x 1 week and left sided headache onset last night.

## 2024-07-29 NOTE — Discharge Instructions (Addendum)
 The COVID test was negative. Apply eardrops as prescribed. Continue over-the-counter Tylenol  as needed for pain, fever, or general discomfort. Warm compresses to the affected ear help with comfort. Do not stick anything inside the ear while symptoms persist. Avoid getting water inside of the ear while symptoms persist. If symptoms fail to improve with this treatment, you may follow-up in this clinic or with your primary care physician for further evaluation. Follow-up as needed.

## 2024-07-29 NOTE — ED Provider Notes (Signed)
 [facility]    CSN: [id number] Arrival date & time: 07/29/24  1701      History   Chief Complaint No chief complaint on file.   HPI Karen [name] is a 23 y.o. female.   The history is provided by the patient.   Patient presents with a 1 week history of left-sided ear pain and headache.  Patient states that she has noticed decreased hearing from the ear ear.  She states that the pain has worsened over the past several days.  She denies fever, chills, ear drainage, dizziness, lightheadedness, runny nose, or cough.  Patient states that she has been trying to look inside of the ear with a ear camera, states that she has pain when she attempts to do that, she also has pain when she is used a Q-tip inside of the ear.  States she has been taking Tylenol  for her symptoms.  Past Medical History:  Diagnosis Date   AKI (acute kidney injury) 11/23/2020   r/t dehydration - covid infection    COVID    Hepatic vein stenosis    Overweight(278.02) 03/03/2013    Patient Active Problem List   Diagnosis Date Noted   Psoriasis 03/31/2024   Acute cystitis without hematuria 03/16/2024   Obesity (BMI 30-39.9) 03/20/2023   Hepatic steatosis 11/23/2020   Elevated BP without diagnosis of hypertension 11/23/2020   AN (acanthosis nigricans) 12/09/2015    Past Surgical History:  Procedure Laterality Date   KNEE ARTHROSCOPY WITH ANTERIOR CRUCIATE LIGAMENT (ACL) REPAIR Left 04/13/2021   Procedure: KNEE ARTHROSCOPY WITH ANTERIOR CRUCIATE LIGAMENT (ACL) REPAIR WITH  LATERAL MENISECTOMY and DRILLING/MICROFRACTURE;  Surgeon: Josefina Chew, MD;  Location: [facility];  Service: Orthopedics;  Laterality: Left;   KNEE ARTHROSCOPY WITH MENISCAL REPAIR Left 04/13/2021   Procedure: KNEE ARTHROSCOPY WITH MENISCAL Root Repair;  Surgeon: Josefina Chew, MD;  Location:  SURGERY CENTER;  Service: Orthopedics;  Laterality: Left;   WISDOM TOOTH EXTRACTION      OB History      Gravida  1   Para      Term      Preterm      AB      Living         SAB      IAB      Ectopic      Multiple      Live Births               Home Medications    Prior to Admission medications   Medication Sig Start Date End Date Taking? Authorizing Provider  ofloxacin (FLOXIN) 0.3 % OTIC solution Place 5 drops into the left ear 2 (two) times daily for 7 days. 07/29/24 08/05/24 Yes Leath-Warren, Etta PARAS, NP  ondansetron  (ZOFRAN -ODT) 4 MG disintegrating tablet Take 1 tablet (4 mg total) by mouth every 8 (eight) hours as needed for nausea or vomiting. 07/06/24   Daralene Lonni BIRCH, PA-C  oxyCODONE -acetaminophen  (PERCOCET/ROXICET) 5-325 MG tablet Take 1 tablet by mouth every 6 (six) hours as needed for severe pain (pain score 7-10). 07/06/24   Daralene Lonni BIRCH, PA-C  triamcinolone  ointment (KENALOG ) 0.5 % Apply 1 Application topically 2 (two) times daily. 03/31/24   Grooms, [place], PA-C    Family History Family History  Problem Relation Age of Onset   Thyroid  disease Mother    Diabetes Father    Hypertension Father    Healthy Sister    Healthy Sister    Healthy  Maternal Grandmother    Healthy Maternal Grandfather    Healthy Paternal Grandmother    Healthy Paternal Grandfather    Cancer Neg Hx    Heart disease Neg Hx     Social History Social History   Tobacco Use   Smoking status: Never    Passive exposure: Never   Smokeless tobacco: Never  Vaping Use   Vaping status: Never Used  Substance Use Topics   Alcohol use: Never   Drug use: Never     Allergies   Patient has no known allergies.   Review of Systems Review of Systems Per HPI  Physical Exam Triage Vital Signs ED Triage Vitals  Encounter Vitals Group     BP 07/29/24 1740 127/82     Girls Systolic BP Percentile --      Girls Diastolic BP Percentile --      Boys Systolic BP Percentile --      Boys Diastolic BP Percentile --      Pulse Rate 07/29/24 1740 88     Resp 07/29/24  1740 16     Temp 07/29/24 1740 99.2 F (37.3 C)     Temp Source 07/29/24 1740 Oral     SpO2 07/29/24 1740 97 %     Weight --      Height --      Head Circumference --      Peak Flow --      Pain Score 07/29/24 1741 9     Pain Loc --      Pain Education --      Exclude from Growth Chart --    No data found.  Updated Vital Signs BP 127/82 (BP Location: Right Arm)   Pulse 88   Temp 99.2 F (37.3 C) (Oral)   Resp 16   LMP 07/04/2024 (Exact Date)   SpO2 97%   Breastfeeding No   Visual Acuity Right Eye Distance:   Left Eye Distance:   Bilateral Distance:    Right Eye Near:   Left Eye Near:    Bilateral Near:     Physical Exam Vitals and nursing note reviewed.  Constitutional:      General: She is not in acute distress.    Appearance: Normal appearance.  HENT:     Head: Normocephalic.     Right Ear: Tympanic membrane, ear canal and external ear normal.     Left Ear: External ear normal.     Nose: Nose normal.     Mouth/Throat:     Mouth: Mucous membranes are moist.  Eyes:     Extraocular Movements: Extraocular movements intact.     Pupils: Pupils are equal, round, and reactive to light.  Cardiovascular:     Rate and Rhythm: Normal rate and regular rhythm.  Pulmonary:     Effort: Pulmonary effort is normal.  Musculoskeletal:     Cervical back: Normal range of motion.  Skin:    General: Skin is warm and dry.  Neurological:     General: No focal deficit present.     Mental Status: She is alert and oriented to person, place, and time.     GCS: GCS eye subscore is 4. GCS verbal subscore is 5. GCS motor subscore is 6.  Psychiatric:        Mood and Affect: Mood normal.        Behavior: Behavior normal.      UC Treatments / Results  Labs (all labs ordered are listed, but only  abnormal results are displayed) Labs Reviewed  POC SOFIA SARS ANTIGEN FIA    EKG   Radiology No results found.  Procedures Procedures (including critical care  time)  Medications Ordered in UC Medications - No data to display  Initial Impression / Assessment and Plan / UC Course  I have reviewed the triage vital signs and the nursing notes.  Pertinent labs & imaging results that were available during my care of the patient were reviewed by me and considered in my medical decision making (see chart for details).  On exam, patient with erythema and swelling of the left ear canal.  She also states that she has tenderness to the tragus of the left ear.  COVID test is negative.  Symptoms consistent with possible otitis externa.  Will treat with Floxin 0.3% eardrops.  Supportive care recommendations were provided and discussed with the patient to include fluids, rest, continuing over-the-counter analgesics, and warm compresses to the ear.  Discussed indications with patient regarding follow-up.  Patient was in agreement with this plan of care and verbalizes understanding.  All questions were answered.  Patient stable for discharge.   Final Clinical Impressions(s) / UC Diagnoses   Final diagnoses:  Nonintractable headache, unspecified chronicity pattern, unspecified headache type  Acute otitis externa of left ear, unspecified type     Discharge Instructions      The COVID test was negative. Apply eardrops as prescribed. Continue over-the-counter Tylenol  as needed for pain, fever, or general discomfort. Warm compresses to the affected ear help with comfort. Do not stick anything inside the ear while symptoms persist. Avoid getting water inside of the ear while symptoms persist. If symptoms fail to improve with this treatment, you may follow-up in this clinic or with your primary care physician for further evaluation. Follow-up as needed.        ED Prescriptions     Medication Sig Dispense Auth. Provider   ofloxacin (FLOXIN) 0.3 % OTIC solution Place 5 drops into the left ear 2 (two) times daily for 7 days. 5 mL Leath-Warren, Etta PARAS, NP       PDMP not reviewed this encounter.   [place] Etta PARAS, NP 07/29/24 1801

## 2024-08-03 ENCOUNTER — Ambulatory Visit (INDEPENDENT_AMBULATORY_CARE_PROVIDER_SITE_OTHER): Payer: Self-pay | Admitting: Urology

## 2024-08-03 ENCOUNTER — Encounter: Payer: Self-pay | Admitting: Urology

## 2024-08-03 VITALS — BP 146/80 | HR 82

## 2024-08-03 DIAGNOSIS — N202 Calculus of kidney with calculus of ureter: Secondary | ICD-10-CM

## 2024-08-03 DIAGNOSIS — N200 Calculus of kidney: Secondary | ICD-10-CM

## 2024-08-03 DIAGNOSIS — N201 Calculus of ureter: Secondary | ICD-10-CM

## 2024-08-03 NOTE — Progress Notes (Signed)
 H&P  Chief Complaint: kidney stone   History of Present Illness:   New pt -   1) urolithiasis - she had right flank pain. A 07/06/2024 CT -  Small nonobstructive calyceal calculi throughout both kidneys. On the right, there is a 3 x 2 x 3 mm calculus at the right UVJ causing mild hydroureteronephrosis. Cr 0.77. UA > 50 rbc. Cx neg.   She hasn't seen a stone pass but pain resolved. No prior stones. No pain, dysuria or gross hematuria.    Past Medical History:  Diagnosis Date   AKI (acute kidney injury) 11/23/2020   r/t dehydration - covid infection    COVID    Hepatic vein stenosis    Overweight(278.02) 03/03/2013   Past Surgical History:  Procedure Laterality Date   KNEE ARTHROSCOPY WITH ANTERIOR CRUCIATE LIGAMENT (ACL) REPAIR Left 04/13/2021   Procedure: KNEE ARTHROSCOPY WITH ANTERIOR CRUCIATE LIGAMENT (ACL) REPAIR WITH  LATERAL MENISECTOMY and DRILLING/MICROFRACTURE;  Surgeon: Josefina Chew, MD;  Location: [facility];  Service: Orthopedics;  Laterality: Left;   KNEE ARTHROSCOPY WITH MENISCAL REPAIR Left 04/13/2021   Procedure: KNEE ARTHROSCOPY WITH MENISCAL Root Repair;  Surgeon: Josefina Chew, MD;  Location: [facility];  Service: Orthopedics;  Laterality: Left;   WISDOM TOOTH EXTRACTION      Home Medications:  (Not in a hospital admission)  Allergies: No Known Allergies  Family History  Problem Relation Age of Onset   Thyroid  disease Mother    Diabetes Father    Hypertension Father    Healthy Sister    Healthy Sister    Healthy Maternal Grandmother    Healthy Maternal Grandfather    Healthy Paternal Grandmother    Healthy Paternal Grandfather    Cancer Neg Hx    Heart disease Neg Hx    Social History:  reports that she has never smoked. She has never been exposed to tobacco smoke. She has never used smokeless tobacco. She reports that she does not drink alcohol and does not use drugs.  ROS: A complete review of systems was performed.   All systems are negative except for pertinent findings as noted. ROS   Physical Exam:  Vital signs in last 24 hours: @VSRANGES @ General:  Alert and oriented, No acute distress HEENT: Normocephalic, atraumatic Cardiovascular: Regular rate and rhythm Lungs: Regular rate and effort Abdomen: Soft, nontender, nondistended, no abdominal masses Back: No CVA tenderness Extremities: No edema Neurologic: Grossly intact  Laboratory Data:  No results found for this or any previous visit (from the past 24 hours). No results found for this or any previous visit (from the past 240 hours). Creatinine: No results for input(s): CREATININE in the last 168 hours.  Impression/Assessment/plan:  1) right ureteral stone - likely pass. Check renal us  ensure resolution of hydro - discussed importance to prevent long term renal damage.   2) renal stones - discussed diet changes to prevent.    Karen [name] 08/03/2024, 9:30 AM

## 2024-08-10 ENCOUNTER — Ambulatory Visit (HOSPITAL_COMMUNITY)
Admission: RE | Admit: 2024-08-10 | Discharge: 2024-08-10 | Disposition: A | Discharge status: Home / Self Care | Source: Ambulatory Visit | Attending: Urology | Admitting: Urology

## 2024-08-10 DIAGNOSIS — N200 Calculus of kidney: Secondary | ICD-10-CM | POA: Insufficient documentation

## 2024-08-10 DIAGNOSIS — N201 Calculus of ureter: Secondary | ICD-10-CM | POA: Diagnosis not present

## 2024-08-14 ENCOUNTER — Ambulatory Visit: Payer: Self-pay

## 2024-08-17 ENCOUNTER — Ambulatory Visit: Admitting: Urology

## 2025-02-01 ENCOUNTER — Ambulatory Visit: Admitting: Urology
